# Patient Record
Sex: Female | Born: 1965 | Race: White | Hispanic: No | Smoking: Former smoker
Health system: Southern US, Community
[De-identification: ages and names within clinical notes are randomized; demographics above are authoritative.]

## PROBLEM LIST (undated history)

## (undated) DIAGNOSIS — F32A Depression, unspecified: Secondary | ICD-10-CM

## (undated) DIAGNOSIS — F329 Major depressive disorder, single episode, unspecified: Secondary | ICD-10-CM

## (undated) DIAGNOSIS — M1990 Unspecified osteoarthritis, unspecified site: Secondary | ICD-10-CM

## (undated) DIAGNOSIS — G4730 Sleep apnea, unspecified: Secondary | ICD-10-CM

## (undated) DIAGNOSIS — E785 Hyperlipidemia, unspecified: Secondary | ICD-10-CM

## (undated) DIAGNOSIS — E119 Type 2 diabetes mellitus without complications: Secondary | ICD-10-CM

## (undated) DIAGNOSIS — H409 Unspecified glaucoma: Secondary | ICD-10-CM

## (undated) DIAGNOSIS — I219 Acute myocardial infarction, unspecified: Secondary | ICD-10-CM

## (undated) DIAGNOSIS — I10 Essential (primary) hypertension: Secondary | ICD-10-CM

## (undated) HISTORY — DX: Essential (primary) hypertension: I10

## (undated) HISTORY — PX: TONSILLECTOMY: SUR1361

## (undated) HISTORY — DX: Acute myocardial infarction, unspecified: I21.9

## (undated) HISTORY — DX: Depression, unspecified: F32.A

## (undated) HISTORY — DX: Unspecified osteoarthritis, unspecified site: M19.90

## (undated) HISTORY — DX: Major depressive disorder, single episode, unspecified: F32.9

## (undated) HISTORY — DX: Type 2 diabetes mellitus without complications: E11.9

## (undated) HISTORY — PX: WISDOM TOOTH EXTRACTION: SHX21

## (undated) HISTORY — DX: Unspecified glaucoma: H40.9

## (undated) HISTORY — DX: Hyperlipidemia, unspecified: E78.5

## (undated) HISTORY — DX: Sleep apnea, unspecified: G47.30

## (undated) HISTORY — PX: APPENDECTOMY: SHX54

---

## 1998-10-10 ENCOUNTER — Emergency Department (HOSPITAL_COMMUNITY): Admission: EM | Admit: 1998-10-10 | Discharge: 1998-10-10 | Payer: Self-pay | Admitting: Emergency Medicine

## 2001-01-02 ENCOUNTER — Ambulatory Visit (HOSPITAL_COMMUNITY): Admission: RE | Admit: 2001-01-02 | Discharge: 2001-01-02 | Payer: Self-pay | Admitting: *Deleted

## 2001-01-02 ENCOUNTER — Encounter: Payer: Self-pay | Admitting: *Deleted

## 2001-02-09 ENCOUNTER — Inpatient Hospital Stay (HOSPITAL_COMMUNITY): Admission: AD | Admit: 2001-02-09 | Discharge: 2001-02-09 | Payer: Self-pay | Admitting: *Deleted

## 2001-04-25 ENCOUNTER — Encounter: Payer: Self-pay | Admitting: *Deleted

## 2001-04-25 ENCOUNTER — Ambulatory Visit (HOSPITAL_COMMUNITY): Admission: RE | Admit: 2001-04-25 | Discharge: 2001-04-25 | Payer: Self-pay | Admitting: *Deleted

## 2001-04-26 ENCOUNTER — Inpatient Hospital Stay (HOSPITAL_COMMUNITY): Admission: AD | Admit: 2001-04-26 | Discharge: 2001-04-28 | Payer: Self-pay | Admitting: *Deleted

## 2001-06-08 ENCOUNTER — Other Ambulatory Visit: Admission: RE | Admit: 2001-06-08 | Discharge: 2001-06-08 | Payer: Self-pay | Admitting: Obstetrics and Gynecology

## 2002-05-17 ENCOUNTER — Encounter (INDEPENDENT_AMBULATORY_CARE_PROVIDER_SITE_OTHER): Payer: Self-pay | Admitting: *Deleted

## 2002-05-17 ENCOUNTER — Emergency Department (HOSPITAL_COMMUNITY): Admission: EM | Admit: 2002-05-17 | Discharge: 2002-05-18 | Payer: Self-pay | Admitting: Emergency Medicine

## 2002-07-27 ENCOUNTER — Encounter: Payer: Self-pay | Admitting: Emergency Medicine

## 2002-07-27 ENCOUNTER — Emergency Department (HOSPITAL_COMMUNITY): Admission: EM | Admit: 2002-07-27 | Discharge: 2002-07-27 | Payer: Self-pay | Admitting: Emergency Medicine

## 2007-09-06 ENCOUNTER — Ambulatory Visit: Payer: Self-pay | Admitting: Obstetrics and Gynecology

## 2007-09-21 ENCOUNTER — Ambulatory Visit: Payer: Self-pay | Admitting: Obstetrics & Gynecology

## 2007-10-19 ENCOUNTER — Ambulatory Visit: Payer: Self-pay | Admitting: Obstetrics and Gynecology

## 2007-11-23 ENCOUNTER — Ambulatory Visit (HOSPITAL_COMMUNITY): Admission: RE | Admit: 2007-11-23 | Discharge: 2007-11-23 | Payer: Self-pay | Admitting: Obstetrics & Gynecology

## 2008-07-09 ENCOUNTER — Emergency Department (HOSPITAL_COMMUNITY): Admission: EM | Admit: 2008-07-09 | Discharge: 2008-07-09 | Payer: Self-pay | Admitting: Emergency Medicine

## 2009-02-13 ENCOUNTER — Ambulatory Visit (HOSPITAL_COMMUNITY): Admission: RE | Admit: 2009-02-13 | Discharge: 2009-02-13 | Payer: Self-pay | Admitting: Obstetrics & Gynecology

## 2010-02-17 ENCOUNTER — Ambulatory Visit (HOSPITAL_COMMUNITY): Admission: RE | Admit: 2010-02-17 | Discharge: 2010-02-17 | Payer: Self-pay | Admitting: Obstetrics & Gynecology

## 2010-02-24 ENCOUNTER — Encounter: Admission: RE | Admit: 2010-02-24 | Discharge: 2010-02-24 | Payer: Self-pay | Admitting: Obstetrics & Gynecology

## 2010-04-14 ENCOUNTER — Encounter: Admission: RE | Admit: 2010-04-14 | Discharge: 2010-04-14 | Payer: Self-pay | Admitting: Family Medicine

## 2010-06-11 ENCOUNTER — Encounter: Admission: RE | Admit: 2010-06-11 | Discharge: 2010-06-11 | Payer: Self-pay | Admitting: Family Medicine

## 2010-08-03 ENCOUNTER — Encounter
Admission: RE | Admit: 2010-08-03 | Discharge: 2010-08-03 | Payer: Self-pay | Source: Home / Self Care | Attending: Family Medicine | Admitting: Family Medicine

## 2010-11-22 ENCOUNTER — Encounter: Payer: Self-pay | Admitting: Obstetrics & Gynecology

## 2011-01-19 ENCOUNTER — Other Ambulatory Visit (HOSPITAL_COMMUNITY): Payer: Self-pay | Admitting: Family Medicine

## 2011-01-19 DIAGNOSIS — Z1231 Encounter for screening mammogram for malignant neoplasm of breast: Secondary | ICD-10-CM

## 2011-02-19 ENCOUNTER — Ambulatory Visit (HOSPITAL_COMMUNITY)
Admission: RE | Admit: 2011-02-19 | Discharge: 2011-02-19 | Disposition: A | Discharge status: Home / Self Care | Payer: Medicaid Other | Source: Ambulatory Visit | Attending: Family Medicine | Admitting: Family Medicine

## 2011-02-19 DIAGNOSIS — Z1231 Encounter for screening mammogram for malignant neoplasm of breast: Secondary | ICD-10-CM | POA: Insufficient documentation

## 2011-03-16 NOTE — Group Therapy Note (Signed)
NAME:  Kristin Reilly, Kristin Reilly NO.:  [id number]   MEDICAL RECORD NO.:  [id number]          PATIENT TYPE:  WOC   LOCATION:  [facility]                   FACILITY:  [facility]   PHYSICIAN:  Johnella Moloney, MD        DATE OF BIRTH:  1966-10-18   DATE OF SERVICE:  09/21/2007                                  CLINIC NOTE   Patient is a 45 year old gravida 3, para 1-0-2-1 who came in on September 06, 2007, desiring a tubal ligation.  At that visit, the risks of a tubal  ligation were discussed with the patient and she was told that given her  weight of 293 pounds at 5 feet 6 inches that this would increase her  risks of surgery.  An IUD was recommended at this point.  Patient  returns today for a 2nd opinion and wants to know if it is possible to  get her tubal ligation.  Upon review of her chart, patient was told that  the risks of surgery secondary to morbid obesity are still a concern  given that this was an elective surgery.  Patient wanted statistics on  her risks as she was told that no studies have been done looking at the  role of morbid obesity and increased risks in laparoscopic tubal  ligations.  Patient then decided that she would want to go ahead with a  Mirena IUD insertion.  The Mirena IUD risks and benefits were discussed  with the patient and an informed consent was obtained.   IUD INSERTION:  A pelvic examination was done and showed the patient to  have a small anteverted uterus.  Speculum was then placed in the  patient's vagina.  The cervix was cleaned with Betadine and her uterus  was noted to sound to 9 cm.  The Mirena IUD was then placed without  complication and the strings were cut to 3 cm outside the external os.  Patient had a small amount of bleeding from the tenaculum site which was  controlled using pressure.  Patient otherwise tolerated procedure well.   IMPRESSION:  Patient is a 45 year old G3, P1-0-2-1 status pot Mirena  intrauterine device placement for  contraception.  Patient tolerated  procedure well.  Patient was told to expect some cramping and some  bleeding after procedure.  She was told to take Motrin as needed for her  cramping.  She was also told that there was a risk of having irregular  bleeding and cramping for the next 3 months and to take Motrin as needed  for any pain.  Patient will follow up in 4 weeks for an intrauterine  device check.           ______________________________  Johnella Moloney, MD     UD/MEDQ  D:  09/21/2007  T:  09/22/2007  Job:  [id number]

## 2011-03-16 NOTE — Group Therapy Note (Signed)
NAME:  Kristin Reilly, Kristin Reilly NO.:  [id number]   MEDICAL RECORD NO.:  [id number]          PATIENT TYPE:  WOC   LOCATION:  [facility]                   FACILITY:  [facility]   PHYSICIAN:  Argentina Donovan, MD        DATE OF BIRTH:  July 22, 1966   DATE OF SERVICE:  09/06/2007                                  CLINIC NOTE   The patient is a 45 year old Caucasian female gravida 3, para 1-0-2-1  who came in desiring a tubal ligation.  I am trying to dissuade her  because of her 293 pounds at 5 feet 6 inches tall.  I told her it would  be a significant risk using laparoscopic exam, and I talked to her about  the IUD both the 10 and the 5-year.  Her biggest concern is down the  line at the end if she has to have it removed, she may not have  insurance to cover in 5 years if we put that one in and the 10-year when  she is worried about the cost coming out.  I told her that the cost  coming out is negligible, compared to the cost of putting another one in  or in the cost of what may happen if she has a complication with tubal  ligation.  She is going to think it over and my feeling is for her  probably, since she has no cramps with her period, and very light  periods, that a ParaGard might be the one that she might decide on.   IMPRESSION:  Patient for permanent birth control with morbid obesity.           ______________________________  Argentina Donovan, MD     PR/MEDQ  D:  09/06/2007  T:  09/07/2007  Job:  [id number]

## 2011-08-10 LAB — POCT PREGNANCY, URINE
Operator id: 159681
Preg Test, Ur: NEGATIVE

## 2012-01-10 ENCOUNTER — Other Ambulatory Visit (HOSPITAL_COMMUNITY): Payer: Self-pay | Admitting: Family Medicine

## 2012-01-10 DIAGNOSIS — Z1231 Encounter for screening mammogram for malignant neoplasm of breast: Secondary | ICD-10-CM

## 2012-02-21 ENCOUNTER — Ambulatory Visit (HOSPITAL_COMMUNITY)
Admission: RE | Admit: 2012-02-21 | Discharge: 2012-02-21 | Disposition: A | Discharge status: Home / Self Care | Payer: Medicaid Other | Source: Ambulatory Visit | Attending: Family Medicine | Admitting: Family Medicine

## 2012-02-21 DIAGNOSIS — Z1231 Encounter for screening mammogram for malignant neoplasm of breast: Secondary | ICD-10-CM

## 2012-04-03 ENCOUNTER — Other Ambulatory Visit: Payer: Self-pay | Admitting: Physician Assistant

## 2012-05-01 ENCOUNTER — Telehealth: Payer: Self-pay | Admitting: General Practice

## 2012-05-01 NOTE — Telephone Encounter (Signed)
I spoke with patient and advised her that spotting is not abnormal with the mirena. She voiced understanding and asked if it could be changed out earlier than November and if Medicaid would cover that. I checked with Mayra Neer and she stated that she didn't think that this would be a problem. Pt informed. She will call to make an appt.

## 2012-05-01 NOTE — Telephone Encounter (Signed)
Pt called and stated that she had her minera inserted November 2008 and it's supposed to be removed this November 2013, but for the past month to month and a half she's been having some "spotting issues". Patient states she wants to know if she should go ahead and schedule an appt to have it replaced now or should she continue with this one until November and states she would like a call back.

## 2012-05-26 ENCOUNTER — Other Ambulatory Visit: Payer: Self-pay | Admitting: Physician Assistant

## 2012-06-16 ENCOUNTER — Encounter: Payer: Self-pay | Admitting: Advanced Practice Midwife

## 2012-06-16 ENCOUNTER — Ambulatory Visit (INDEPENDENT_AMBULATORY_CARE_PROVIDER_SITE_OTHER): Payer: Medicaid Other | Admitting: Advanced Practice Midwife

## 2012-06-16 VITALS — BP 130/83 | HR 62 | Temp 97.4°F | Resp 12 | Ht 65.0 in | Wt 316.8 lb

## 2012-06-16 DIAGNOSIS — Z01812 Encounter for preprocedural laboratory examination: Secondary | ICD-10-CM

## 2012-06-16 NOTE — Progress Notes (Signed)
Was discussed with patient that since she has medicaid - it will not cover her to get IUD reinserted now, as is not due until 09/2012. Will reschedule to November appt.

## 2012-07-05 ENCOUNTER — Other Ambulatory Visit: Payer: Self-pay | Admitting: Family Medicine

## 2012-07-05 DIAGNOSIS — Z8249 Family history of ischemic heart disease and other diseases of the circulatory system: Secondary | ICD-10-CM

## 2012-07-12 ENCOUNTER — Telehealth: Payer: Self-pay | Admitting: Family Medicine

## 2012-07-12 ENCOUNTER — Other Ambulatory Visit: Payer: Self-pay | Admitting: Family Medicine

## 2012-07-12 ENCOUNTER — Ambulatory Visit
Admission: RE | Admit: 2012-07-12 | Discharge: 2012-07-12 | Disposition: A | Discharge status: Home / Self Care | Payer: Medicaid Other | Source: Ambulatory Visit | Attending: Family Medicine | Admitting: Family Medicine

## 2012-07-12 DIAGNOSIS — Z8249 Family history of ischemic heart disease and other diseases of the circulatory system: Secondary | ICD-10-CM

## 2012-07-12 DIAGNOSIS — M549 Dorsalgia, unspecified: Secondary | ICD-10-CM

## 2012-07-12 NOTE — Telephone Encounter (Signed)
I called patient because she had an appointment on 06/16/12 to have her IUD removed, and a new mirena inserted. Patient had IUD inserted 09/21/07. Was told by RN Bonita Quin that medicaid might not pay because it has to be 5 years. Patient has been instructed to call and make an appointment in October for an appointment in November after the 21st.

## 2012-08-10 ENCOUNTER — Encounter: Payer: Self-pay | Admitting: Obstetrics & Gynecology

## 2012-09-20 ENCOUNTER — Ambulatory Visit: Payer: Medicaid Other | Admitting: Obstetrics and Gynecology

## 2012-09-21 ENCOUNTER — Encounter: Payer: Self-pay | Admitting: Obstetrics & Gynecology

## 2012-09-21 ENCOUNTER — Ambulatory Visit (INDEPENDENT_AMBULATORY_CARE_PROVIDER_SITE_OTHER): Payer: Medicaid Other | Admitting: Obstetrics & Gynecology

## 2012-09-21 VITALS — BP 134/89 | HR 82 | Temp 97.3°F | Ht 65.0 in | Wt 305.7 lb

## 2012-09-21 DIAGNOSIS — Z30430 Encounter for insertion of intrauterine contraceptive device: Secondary | ICD-10-CM

## 2012-09-21 DIAGNOSIS — Z3009 Encounter for other general counseling and advice on contraception: Secondary | ICD-10-CM

## 2012-09-21 DIAGNOSIS — Z30431 Encounter for routine checking of intrauterine contraceptive device: Secondary | ICD-10-CM

## 2012-09-21 MED ORDER — LEVONORGESTREL 20 MCG/24HR IU IUD
INTRAUTERINE_SYSTEM | Freq: Once | INTRAUTERINE | Status: AC
  Administered 2012-09-21: 1 via INTRAUTERINE

## 2012-09-21 NOTE — Progress Notes (Signed)
Patient ID: Kristin Reilly, female   DOB: July 12, 1966, 46 y.o.   MRN: [id number] Patient identified, informed consent performed.  Discussed risks of irregular bleeding, cramping, infection, malpositioning or misplacement of the IUD outside the uterus which may require further procedures. Time out was performed.  Urine pregnancy test negative.  Speculum placed in the vagina.  Cervix visualized. Patients IUD strings noted and IUD removed.   Cervix cleaned with Betadine x 2. Hurricaine spray applied to ant lip of cervix.  Cervix grasped anteriorly with a single tooth tenaculum.  Uterus sounded to 7 cm.  Mirena IUD placed per manufacturer's recommendations.  Strings trimmed to 3 cm. Tenaculum was removed, good hemostasis noted.  Patient tolerated procedure well.   Patient was given post-procedure instructions and the Mirena care card with expiration date.  Patient was also asked to check IUD strings periodically.  She does not want a scheduled f/u appt but, will call if she has problems.  Saber Dickerman L. Harraway-Smith, M.D., Evern Core

## 2012-09-21 NOTE — Patient Instructions (Signed)

## 2012-09-21 NOTE — Addendum Note (Signed)
Addended by: Faythe Casa on: 09/21/2012 02:35 PM   Modules accepted: Orders

## 2012-09-21 NOTE — Progress Notes (Signed)
Here for IUD removal and reinsertion- has been in 5 years 1 day

## 2013-06-25 DIAGNOSIS — E559 Vitamin D deficiency, unspecified: Secondary | ICD-10-CM | POA: Insufficient documentation

## 2013-06-25 DIAGNOSIS — E781 Pure hyperglyceridemia: Secondary | ICD-10-CM | POA: Insufficient documentation

## 2013-06-25 DIAGNOSIS — R7309 Other abnormal glucose: Secondary | ICD-10-CM | POA: Insufficient documentation

## 2013-06-25 DIAGNOSIS — G4733 Obstructive sleep apnea (adult) (pediatric): Secondary | ICD-10-CM | POA: Insufficient documentation

## 2013-06-25 DIAGNOSIS — R6889 Other general symptoms and signs: Secondary | ICD-10-CM | POA: Insufficient documentation

## 2013-06-25 DIAGNOSIS — M79609 Pain in unspecified limb: Secondary | ICD-10-CM | POA: Insufficient documentation

## 2013-06-25 DIAGNOSIS — M549 Dorsalgia, unspecified: Secondary | ICD-10-CM | POA: Insufficient documentation

## 2013-06-25 DIAGNOSIS — R930 Abnormal findings on diagnostic imaging of skull and head, not elsewhere classified: Secondary | ICD-10-CM | POA: Insufficient documentation

## 2013-06-25 DIAGNOSIS — E668 Other obesity: Secondary | ICD-10-CM | POA: Insufficient documentation

## 2013-06-25 DIAGNOSIS — Z823 Family history of stroke: Secondary | ICD-10-CM | POA: Insufficient documentation

## 2013-11-19 DIAGNOSIS — Z87891 Personal history of nicotine dependence: Secondary | ICD-10-CM | POA: Insufficient documentation

## 2013-11-19 DIAGNOSIS — L84 Corns and callosities: Secondary | ICD-10-CM | POA: Insufficient documentation

## 2013-12-07 DIAGNOSIS — I10 Essential (primary) hypertension: Secondary | ICD-10-CM | POA: Insufficient documentation

## 2014-03-18 DIAGNOSIS — M25559 Pain in unspecified hip: Secondary | ICD-10-CM | POA: Insufficient documentation

## 2014-04-24 DIAGNOSIS — M7060 Trochanteric bursitis, unspecified hip: Secondary | ICD-10-CM | POA: Insufficient documentation

## 2014-04-24 DIAGNOSIS — M7062 Trochanteric bursitis, left hip: Secondary | ICD-10-CM | POA: Insufficient documentation

## 2014-05-27 ENCOUNTER — Other Ambulatory Visit: Payer: Self-pay | Admitting: Family

## 2014-05-27 DIAGNOSIS — R930 Abnormal findings on diagnostic imaging of skull and head, not elsewhere classified: Secondary | ICD-10-CM

## 2014-06-06 ENCOUNTER — Ambulatory Visit
Admission: RE | Admit: 2014-06-06 | Discharge: 2014-06-06 | Disposition: A | Discharge status: Home / Self Care | Payer: Managed Care, Other (non HMO) | Source: Ambulatory Visit | Attending: Family | Admitting: Family

## 2014-06-06 DIAGNOSIS — R930 Abnormal findings on diagnostic imaging of skull and head, not elsewhere classified: Secondary | ICD-10-CM

## 2014-08-16 DIAGNOSIS — R21 Rash and other nonspecific skin eruption: Secondary | ICD-10-CM | POA: Insufficient documentation

## 2014-09-02 ENCOUNTER — Encounter: Payer: Self-pay | Admitting: Obstetrics & Gynecology

## 2014-11-18 ENCOUNTER — Ambulatory Visit
Admission: RE | Admit: 2014-11-18 | Discharge: 2014-11-18 | Disposition: A | Discharge status: Home / Self Care | Payer: Managed Care, Other (non HMO) | Source: Ambulatory Visit | Attending: Nurse Practitioner | Admitting: Nurse Practitioner

## 2014-11-18 ENCOUNTER — Other Ambulatory Visit: Payer: Self-pay | Admitting: Nurse Practitioner

## 2014-11-18 DIAGNOSIS — R0602 Shortness of breath: Secondary | ICD-10-CM

## 2014-11-18 DIAGNOSIS — R059 Cough, unspecified: Secondary | ICD-10-CM

## 2014-11-18 DIAGNOSIS — R05 Cough: Secondary | ICD-10-CM

## 2014-11-20 ENCOUNTER — Encounter: Payer: Self-pay | Admitting: Cardiovascular Disease

## 2014-12-23 DIAGNOSIS — J988 Other specified respiratory disorders: Secondary | ICD-10-CM | POA: Insufficient documentation

## 2014-12-30 ENCOUNTER — Other Ambulatory Visit: Payer: Self-pay | Admitting: Family Medicine

## 2014-12-30 ENCOUNTER — Ambulatory Visit
Admission: RE | Admit: 2014-12-30 | Discharge: 2014-12-30 | Disposition: A | Discharge status: Home / Self Care | Payer: Managed Care, Other (non HMO) | Source: Ambulatory Visit | Attending: Family Medicine | Admitting: Family Medicine

## 2014-12-30 DIAGNOSIS — R0600 Dyspnea, unspecified: Secondary | ICD-10-CM

## 2014-12-30 DIAGNOSIS — R0602 Shortness of breath: Secondary | ICD-10-CM

## 2014-12-30 DIAGNOSIS — R059 Cough, unspecified: Secondary | ICD-10-CM

## 2014-12-30 DIAGNOSIS — R05 Cough: Secondary | ICD-10-CM

## 2015-03-21 ENCOUNTER — Other Ambulatory Visit: Payer: Self-pay | Admitting: Nurse Practitioner

## 2015-03-21 ENCOUNTER — Other Ambulatory Visit: Payer: Self-pay

## 2015-03-21 DIAGNOSIS — Z1231 Encounter for screening mammogram for malignant neoplasm of breast: Secondary | ICD-10-CM

## 2015-03-27 ENCOUNTER — Encounter: Payer: Self-pay | Admitting: Internal Medicine

## 2015-04-02 ENCOUNTER — Ambulatory Visit
Admission: RE | Admit: 2015-04-02 | Discharge: 2015-04-02 | Disposition: A | Discharge status: Home / Self Care | Payer: Managed Care, Other (non HMO) | Source: Ambulatory Visit

## 2015-04-02 DIAGNOSIS — Z1231 Encounter for screening mammogram for malignant neoplasm of breast: Secondary | ICD-10-CM

## 2015-05-19 ENCOUNTER — Encounter: Payer: Self-pay | Admitting: *Deleted

## 2015-05-19 ENCOUNTER — Other Ambulatory Visit: Payer: Self-pay | Admitting: *Deleted

## 2015-06-05 ENCOUNTER — Other Ambulatory Visit: Payer: Self-pay

## 2015-06-05 ENCOUNTER — Telehealth: Payer: Self-pay | Admitting: *Deleted

## 2015-06-05 DIAGNOSIS — Z1211 Encounter for screening for malignant neoplasm of colon: Secondary | ICD-10-CM

## 2015-06-05 NOTE — Telephone Encounter (Signed)
Kristin Reilly, Can you schedule this for me please?  Thanks, [employer]

## 2015-06-05 NOTE — Telephone Encounter (Signed)
Pt has been scheduled for colon with propofol at [facility] 08/05/15@9 :15am. Please let her  Know during the previsit.

## 2015-06-05 NOTE — Telephone Encounter (Signed)
[place] for next Tuesday am outpatient day at [facility] for screening colon with MAC

## 2015-06-05 NOTE — Telephone Encounter (Signed)
Dr. Hilarie Fredrickson, This pt is coming in for a PV on Monday for a screening colonoscopy.  Her last BMI on 03-19-15 was 52.40.  There is not a lot of info in EPIC about this pt- the last encounter was from 2013.  We do have referral information from her PCP- I'll put this in your inbox on your desk.   If she comes in for her PV and is BMI is still above 50, is she ok for a direct hospital colonoscopy or would you like an office visit first?  Thanks, Cyril Mourning

## 2015-06-09 ENCOUNTER — Ambulatory Visit (AMBULATORY_SURGERY_CENTER): Payer: Self-pay | Admitting: *Deleted

## 2015-06-09 VITALS — Ht 66.0 in | Wt 321.2 lb

## 2015-06-09 DIAGNOSIS — Z1211 Encounter for screening for malignant neoplasm of colon: Secondary | ICD-10-CM

## 2015-06-09 MED ORDER — NA SULFATE-K SULFATE-MG SULF 17.5-3.13-1.6 GM/177ML PO SOLN
ORAL | Status: DC
Start: 2015-06-09 — End: 2018-12-19

## 2015-06-09 NOTE — Progress Notes (Signed)
Patient denies any allergies to eggs or soy. Patient denies any problems with anesthesia/sedation. Patient denies any oxygen use at home and does not take any diet/weight loss medications. EMMI education assisgned to patient on colonoscopy, this was explained and instructions given to patient. 

## 2015-06-23 ENCOUNTER — Encounter: Payer: Managed Care, Other (non HMO) | Admitting: Internal Medicine

## 2015-07-03 ENCOUNTER — Encounter: Payer: Self-pay | Admitting: Internal Medicine

## 2015-07-04 ENCOUNTER — Encounter: Payer: Self-pay | Admitting: Cardiovascular Disease

## 2015-07-04 ENCOUNTER — Encounter: Payer: Self-pay | Admitting: Internal Medicine

## 2015-07-22 ENCOUNTER — Encounter (HOSPITAL_COMMUNITY): Payer: Self-pay | Admitting: *Deleted

## 2015-08-05 ENCOUNTER — Encounter (HOSPITAL_COMMUNITY): Payer: Self-pay | Admitting: *Deleted

## 2015-08-05 ENCOUNTER — Ambulatory Visit (HOSPITAL_COMMUNITY): Payer: Managed Care, Other (non HMO) | Admitting: Registered Nurse

## 2015-08-05 ENCOUNTER — Encounter (HOSPITAL_COMMUNITY)
Admission: RE | Disposition: A | Discharge status: Home / Self Care | Payer: Self-pay | Source: Ambulatory Visit | Attending: Internal Medicine

## 2015-08-05 ENCOUNTER — Ambulatory Visit (HOSPITAL_COMMUNITY)
Admission: RE | Admit: 2015-08-05 | Discharge: 2015-08-05 | Disposition: A | Discharge status: Home / Self Care | Payer: Managed Care, Other (non HMO) | Source: Ambulatory Visit | Attending: Internal Medicine | Admitting: Internal Medicine

## 2015-08-05 DIAGNOSIS — Z87891 Personal history of nicotine dependence: Secondary | ICD-10-CM | POA: Diagnosis not present

## 2015-08-05 DIAGNOSIS — D123 Benign neoplasm of transverse colon: Secondary | ICD-10-CM | POA: Diagnosis not present

## 2015-08-05 DIAGNOSIS — I10 Essential (primary) hypertension: Secondary | ICD-10-CM | POA: Diagnosis not present

## 2015-08-05 DIAGNOSIS — K648 Other hemorrhoids: Secondary | ICD-10-CM | POA: Diagnosis not present

## 2015-08-05 DIAGNOSIS — D128 Benign neoplasm of rectum: Secondary | ICD-10-CM | POA: Diagnosis not present

## 2015-08-05 DIAGNOSIS — K5730 Diverticulosis of large intestine without perforation or abscess without bleeding: Secondary | ICD-10-CM | POA: Diagnosis not present

## 2015-08-05 DIAGNOSIS — K621 Rectal polyp: Secondary | ICD-10-CM | POA: Diagnosis not present

## 2015-08-05 DIAGNOSIS — Z1211 Encounter for screening for malignant neoplasm of colon: Secondary | ICD-10-CM | POA: Diagnosis not present

## 2015-08-05 DIAGNOSIS — Z6841 Body Mass Index (BMI) 40.0 and over, adult: Secondary | ICD-10-CM | POA: Diagnosis not present

## 2015-08-05 DIAGNOSIS — G4730 Sleep apnea, unspecified: Secondary | ICD-10-CM | POA: Insufficient documentation

## 2015-08-05 HISTORY — PX: COLONOSCOPY: SHX5424

## 2015-08-05 SURGERY — COLONOSCOPY
Anesthesia: Monitor Anesthesia Care

## 2015-08-05 MED ORDER — LACTATED RINGERS IV SOLN
INTRAVENOUS | Status: DC
  Administered 2015-08-05: 1000 mL via INTRAVENOUS

## 2015-08-05 MED ORDER — GLYCOPYRROLATE 0.2 MG/ML IJ SOLN
INTRAMUSCULAR | Status: DC | PRN
  Administered 2015-08-05: 0.2 mg via INTRAVENOUS

## 2015-08-05 MED ORDER — SODIUM CHLORIDE 0.9 % IV SOLN: INTRAVENOUS | Status: DC

## 2015-08-05 MED ORDER — ONDANSETRON HCL 4 MG/2ML IJ SOLN
INTRAMUSCULAR | Status: AC
  Filled 2015-08-05: qty 2

## 2015-08-05 MED ORDER — PROPOFOL 10 MG/ML IV BOLUS
INTRAVENOUS | Status: AC
  Filled 2015-08-05: qty 20

## 2015-08-05 MED ORDER — PROPOFOL 500 MG/50ML IV EMUL
INTRAVENOUS | Status: DC | PRN
  Administered 2015-08-05: 200 ug/kg/min via INTRAVENOUS

## 2015-08-05 MED ORDER — LABETALOL HCL 5 MG/ML IV SOLN
INTRAVENOUS | Status: AC
  Filled 2015-08-05: qty 4

## 2015-08-05 MED ORDER — GLYCOPYRROLATE 0.2 MG/ML IJ SOLN
INTRAMUSCULAR | Status: AC
  Filled 2015-08-05: qty 1

## 2015-08-05 MED ORDER — ONDANSETRON HCL 4 MG/2ML IJ SOLN
INTRAMUSCULAR | Status: DC | PRN
  Administered 2015-08-05: 4 mg via INTRAVENOUS

## 2015-08-05 MED ORDER — LABETALOL HCL 5 MG/ML IV SOLN
INTRAVENOUS | Status: DC | PRN
  Administered 2015-08-05: 5 mg via INTRAVENOUS
  Administered 2015-08-05: 2.5 mg via INTRAVENOUS

## 2015-08-05 NOTE — H&P (Signed)
  HPI: Kristin Reilly is a 49 year old female with past medical history of hypertension, sleep apnea and morbid obesity who presents for outpatient screening colonoscopy. She has never had a colonoscopy before. Reports no change in bowel habit, including the rectal bleeding or melena. No abdominal pain. No diarrhea or constipation. Her brother had polyps when he was a young child but she is not aware that they were precancerous. No family history of colorectal cancer or IBD. She sees Dr. Coletta Memos for primary care.  Past Medical History  Diagnosis Date  . Hypertension   . Depression   . Sleep apnea     no cpap    Past Surgical History  Procedure Laterality Date  . Appendectomy    . Tonsillectomy    . Wisdom tooth extraction       (Not in an outpatient encounter)  Allergies  Allergen Reactions  . Fenofibrate Rash  . Tylox [Oxycodone-Acetaminophen] Other (See Comments)    Migraine HA and GI upset    Family History  Problem Relation Age of Onset  . CVA Mother   . Cerebral aneurysm Mother   . Clotting disorder Mother   . Aneurysm Maternal Grandmother   . Mental illness Sister   . Alcoholism Brother   . Throat cancer Maternal Grandfather   . Bipolar disorder Sister   . ADD / ADHD Son   . Colon cancer Neg Hx     Social History  Substance Use Topics  . Smoking status: Former Smoker    Quit date: 07/21/2005  . Smokeless tobacco: Never Used     Comment: quit 2008  . Alcohol Use: 0.0 oz/week    0 Standard drinks or equivalent per week     Comment: occasionally    ROS: As per history of present illness, otherwise negative  BP 160/74 mmHg  Temp(Src) 97.6 F (36.4 C) (Oral)  Resp 11  Ht 5\' 6"  (1.676 m)  Wt 315 lb (142.883 kg)  BMI 50.87 kg/m2  SpO2 98% Gen: awake, alert, NAD HEENT: anicteric, op clear CV: RRR, no mrg Pulm: CTA b/l Abd: soft, obese, NT/ND, +BS throughout Ext: no c/c/e Neuro: nonfocal   ASSESSMENT/PLAN:  49 year old female with past medical  history of hypertension, sleep apnea and morbid obesity who presents for outpatient screening colonoscopy.   1. CRC screening -- outpatient screening colonoscopy today with monitored anesthesia care.  The nature of the procedure, as well as the risks, benefits, and alternatives were carefully and thoroughly reviewed with the patient. Ample time for discussion and questions allowed. The patient understood, was satisfied, and agreed to proceed.

## 2015-08-05 NOTE — Anesthesia Preprocedure Evaluation (Signed)
Anesthesia Evaluation  Patient identified by MRN, date of birth, ID band Patient awake    Reviewed: Allergy & Precautions, NPO status , Patient's Chart, lab work & pertinent test results  Airway Mallampati: II  TM Distance: >3 FB Neck ROM: Full    Dental no notable dental hx.    Pulmonary sleep apnea , former smoker,    Pulmonary exam normal breath sounds clear to auscultation       Cardiovascular hypertension, Pt. on medications Normal cardiovascular exam Rhythm:Regular Rate:Normal     Neuro/Psych negative neurological ROS  negative psych ROS   GI/Hepatic negative GI ROS, Neg liver ROS,   Endo/Other  Morbid obesity  Renal/GU negative Renal ROS  negative genitourinary   Musculoskeletal negative musculoskeletal ROS (+)   Abdominal   Peds negative pediatric ROS (+)  Hematology negative hematology ROS (+)   Anesthesia Other Findings   Reproductive/Obstetrics negative OB ROS                             Anesthesia Physical Anesthesia Plan  ASA: III  Anesthesia Plan: MAC   Post-op Pain Management:    Induction:   Airway Management Planned: Simple Face Mask  Additional Equipment:   Intra-op Plan:   Post-operative Plan:   Informed Consent: I have reviewed the patients History and Physical, chart, labs and discussed the procedure including the risks, benefits and alternatives for the proposed anesthesia with the patient or authorized representative who has indicated his/her understanding and acceptance.   Dental advisory given  Plan Discussed with: CRNA  Anesthesia Plan Comments:         Anesthesia Quick Evaluation

## 2015-08-05 NOTE — Transfer of Care (Signed)
Immediate Anesthesia Transfer of Care Note  Patient: Kristin Reilly  Procedure(s) Performed: Procedure(s): COLONOSCOPY (N/A)  Patient Location: PACU  Anesthesia Type:MAC  Level of Consciousness: awake, alert , oriented and patient cooperative  Airway & Oxygen Therapy: Patient Spontanous Breathing and Patient connected to face mask oxygen  Post-op Assessment: Report given to RN, Post -op Vital signs reviewed and stable and Patient moving all extremities X 4  Post vital signs: stable  Last Vitals:  Filed Vitals:   08/05/15 0952  BP: 120/61  Temp:   Resp: 15    Complications: No apparent anesthesia complications

## 2015-08-05 NOTE — Discharge Instructions (Signed)

## 2015-08-05 NOTE — Op Note (Signed)
[facility] [place], [address]   COLONOSCOPY PROCEDURE REPORT  PATIENT: Kristin Reilly, Kristin Reilly  MR#: [id number] BIRTHDATE: 10-01-1966 , 49  yrs. old GENDER: female ENDOSCOPIST: Jerene Bears, MD REFERRED QQ:PYPPJ Bouska, M.D. PROCEDURE DATE:  08/05/2015 PROCEDURE:   Colonoscopy, screening, Colonoscopy with cold biopsy polypectomy, and Colonoscopy with snare polypectomy First Screening Colonoscopy - Avg.  risk and is 50 yrs.  old or older Yes.  Prior Negative Screening - Now for repeat screening. N/A  History of Adenoma - Now for follow-up colonoscopy & has been > or = to 3 yrs.  N/A  Polyps removed today? Yes ASA CLASS:   Class III INDICATIONS:Screening for colonic neoplasia, Colorectal Neoplasm Risk Assessment for this procedure is average risk, and 1st colonoscopy. MEDICATIONS: Monitored anesthesia care and Per Anesthesia  DESCRIPTION OF PROCEDURE:   After the risks benefits and alternatives of the procedure were thoroughly explained, informed consent was obtained.  The digital rectal exam revealed no rectal mass.   The Pentax Ped Colon [id number]  endoscope was introduced through the anus and advanced to the cecum, which was identified by both the appendix and ileocecal valve. No adverse events experienced.   The quality of the prep was good.  (Suprep was used) The instrument was then slowly withdrawn as the colon was fully examined. Estimated blood loss is zero unless otherwise noted in this procedure report.  COLON FINDINGS: Lipomatous IC valve.  A sessile polyp measuring 10 mm in size was found in the proximal transverse colon.  A polypectomy was performed with a cold snare.  The resection was complete, the polyp tissue was completely retrieved and sent to histology.   A sessile polyp measuring 3 mm in size was found in the rectum.  A polypectomy was performed with cold forceps.  The resection was complete, the polyp tissue was completely  retrieved and sent to histology.   There was mild diverticulosis noted in the ascending colon, at the cecum, and in the left colon.  Retroflexed views revealed internal hemorrhoids. The time to cecum = 2.3 Withdrawal time = 19.5   The scope was withdrawn and the procedure completed. COMPLICATIONS: There were no immediate complications.  ENDOSCOPIC IMPRESSION: 1.   Sessile polyp was found in the proximal transverse colon; polypectomy was performed with a cold snare 2.   Sessile polyp was found in the rectum; polypectomy was performed with cold forceps 3.   Mild diverticulosis was noted in the ascending colon, at the cecum, and in the left colon  RECOMMENDATIONS: 1.  Await pathology results 2.  High fiber diet 3.  Timing of repeat colonoscopy will be determined by pathology findings. 4.  You will receive a letter within 1-2 weeks with the results of your biopsy as well as final recommendations.  Please call my office if you have not received a letter after 3 weeks.  eSigned:  Jerene Bears, MD 08/05/2015 9:53 AM   cc: Bernerd Limbo, MD and The Patient

## 2015-08-06 ENCOUNTER — Encounter (HOSPITAL_COMMUNITY): Payer: Self-pay | Admitting: Internal Medicine

## 2015-08-06 ENCOUNTER — Encounter: Payer: Self-pay | Admitting: Internal Medicine

## 2015-08-06 NOTE — Anesthesia Postprocedure Evaluation (Signed)
  Anesthesia Post-op Note  Patient: Kristin Reilly  Procedure(s) Performed: Procedure(s) (LRB): COLONOSCOPY (N/A)  Patient Location: PACU  Anesthesia Type: MAC  Level of Consciousness: awake and alert   Airway and Oxygen Therapy: Patient Spontanous Breathing  Post-op Pain: mild  Post-op Assessment: Post-op Vital signs reviewed, Patient's Cardiovascular Status Stable, Respiratory Function Stable, Patent Airway and No signs of Nausea or vomiting  Last Vitals:  Filed Vitals:   08/05/15 1009  BP: 118/52  Temp:   Resp: 16    Post-op Vital Signs: stable   Complications: No apparent anesthesia complications

## 2015-08-09 ENCOUNTER — Encounter (HOSPITAL_COMMUNITY): Payer: Self-pay

## 2015-08-09 ENCOUNTER — Emergency Department (HOSPITAL_COMMUNITY): Payer: Managed Care, Other (non HMO)

## 2015-08-09 ENCOUNTER — Emergency Department (HOSPITAL_COMMUNITY)
Admission: EM | Admit: 2015-08-09 | Discharge: 2015-08-09 | Disposition: A | Discharge status: Home / Self Care | Payer: Managed Care, Other (non HMO) | Attending: Emergency Medicine | Admitting: Emergency Medicine

## 2015-08-09 DIAGNOSIS — Z87891 Personal history of nicotine dependence: Secondary | ICD-10-CM | POA: Diagnosis not present

## 2015-08-09 DIAGNOSIS — M7551 Bursitis of right shoulder: Secondary | ICD-10-CM | POA: Insufficient documentation

## 2015-08-09 DIAGNOSIS — Z8659 Personal history of other mental and behavioral disorders: Secondary | ICD-10-CM | POA: Diagnosis not present

## 2015-08-09 DIAGNOSIS — Z8669 Personal history of other diseases of the nervous system and sense organs: Secondary | ICD-10-CM | POA: Insufficient documentation

## 2015-08-09 DIAGNOSIS — Z791 Long term (current) use of non-steroidal anti-inflammatories (NSAID): Secondary | ICD-10-CM | POA: Insufficient documentation

## 2015-08-09 DIAGNOSIS — I10 Essential (primary) hypertension: Secondary | ICD-10-CM | POA: Insufficient documentation

## 2015-08-09 DIAGNOSIS — M79601 Pain in right arm: Secondary | ICD-10-CM | POA: Diagnosis present

## 2015-08-09 MED ORDER — ACETAMINOPHEN 500 MG PO TABS
1000.0000 mg | ORAL_TABLET | Freq: Once | ORAL | Status: AC
  Administered 2015-08-09: 1000 mg via ORAL
  Filled 2015-08-09: qty 2

## 2015-08-09 NOTE — ED Provider Notes (Signed)
CSN: [id number]     Arrival date & time 08/09/15  0932 History   First MD Initiated Contact with Patient 08/09/15 0940     Chief Complaint  Patient presents with  . Arm Pain     (Consider location/radiation/quality/duration/timing/severity/associated sxs/prior Treatment) Patient is a 49 y.o. female presenting with shoulder pain. The history is provided by the patient.  Shoulder Pain Location:  Shoulder Time since incident:  1 week Injury: no   Shoulder location:  R shoulder Pain details:    Quality:  Aching   Radiates to:  Does not radiate   Severity:  Moderate   Onset quality:  Gradual   Duration:  1 week   Timing:  Constant   Progression:  Worsening Chronicity:  New Handedness:  Left-handed Prior injury to area:  No Relieved by:  Rest Worsened by:  Movement and bearing weight Ineffective treatments:  None tried Associated symptoms: tingling   Associated symptoms: no fever    49 yo F with a chief complaint right shoulder pain. This been going on for about a week. Patient denies any injury. Had a colonoscopy about a week ago but says her pain was prior to that event. Slowly worsening. Patient is taking Celebrex not taking any other medication for this. Patient denies any chest pain or shortness breath. Pain is worse with forward flexion.  Past Medical History  Diagnosis Date  . Hypertension   . Depression   . Sleep apnea     no cpap   Past Surgical History  Procedure Laterality Date  . Appendectomy    . Tonsillectomy    . Wisdom tooth extraction    . Colonoscopy N/A 08/05/2015    Procedure: COLONOSCOPY;  Surgeon: Jerene Bears, MD;  Location: [facility];  Service: Gastroenterology;  Laterality: N/A;   Family History  Problem Relation Age of Onset  . CVA Mother   . Cerebral aneurysm Mother   . Clotting disorder Mother   . Aneurysm Maternal Grandmother   . Mental illness Sister   . Alcoholism Brother   . Throat cancer Maternal Grandfather   . Bipolar disorder  Sister   . ADD / ADHD Son   . Colon cancer Neg Hx    Social History  Substance Use Topics  . Smoking status: Former Smoker    Quit date: 07/21/2005  . Smokeless tobacco: Never Used     Comment: quit 2008  . Alcohol Use: 0.0 oz/week    0 Standard drinks or equivalent per week     Comment: occasionally   OB History    Gravida Para Term Preterm AB TAB SAB Ectopic Multiple Living   3 1 1  0 2 1 1  0 0 1     Review of Systems  Constitutional: Negative for fever and chills.  HENT: Negative for congestion and rhinorrhea.   Eyes: Negative for redness and visual disturbance.  Respiratory: Negative for shortness of breath and wheezing.   Cardiovascular: Negative for chest pain and palpitations.  Gastrointestinal: Negative for nausea and vomiting.  Genitourinary: Negative for dysuria and urgency.  Musculoskeletal: Positive for myalgias and arthralgias.  Skin: Negative for pallor and wound.  Neurological: Negative for dizziness and headaches.      Allergies  Fenofibrate and Tylox  Home Medications   Prior to Admission medications   Medication Sig Start Date End Date Taking? Authorizing Provider  celecoxib (CELEBREX) 200 MG capsule Take 200 mg by mouth daily. For pain 03/18/15 03/17/16 Yes Historical Provider, MD  hydrochlorothiazide (  HYDRODIURIL) 25 MG tablet Take 25 mg by mouth daily as needed (FOR FLUID).    Yes Historical Provider, MD  levonorgestrel (MIRENA, 52 MG,) 20 MCG/24HR IUD 20 mcg by Intrauterine route once. IUD - inserted 11/22/2011   Yes Historical Provider, MD  olmesartan (BENICAR) 20 MG tablet Take 20 mg by mouth every morning.    Yes Historical Provider, MD  Na Sulfate-K Sulfate-Mg Sulf SOLN Suprep (no substitutions)-TAKE AS DIRECTED. Patient not taking: Reported on 08/09/2015 06/09/15   Jerene Bears, MD   BP 141/69 mmHg  Pulse 70  Temp(Src) 97.7 F (36.5 C) (Oral)  Resp 16  SpO2 99% Physical Exam  Constitutional: She is oriented to person, place, and time. She  appears well-developed and well-nourished. No distress.  HENT:  Head: Normocephalic and atraumatic.  Eyes: EOM are normal. Pupils are equal, round, and reactive to light.  Neck: Normal range of motion. Neck supple.  Cardiovascular: Normal rate and regular rhythm.  Exam reveals no gallop and no friction rub.   No murmur heard. Pulmonary/Chest: Effort normal. She has no wheezes. She has no rales.  Abdominal: Soft. She exhibits no distension. There is no tenderness. There is no rebound and no guarding.  Musculoskeletal: She exhibits tenderness. She exhibits no edema.  Tender palpation about the right before meals joint in the right biceps groove. Pain when patient raises her arm with forward flexion, up to about 90. Mild weakness to the supraspinatus. Otherwise sits muscles intact  Neurological: She is alert and oriented to person, place, and time.  Skin: Skin is warm and dry. She is not diaphoretic.  Psychiatric: She has a normal mood and affect. Her behavior is normal.    ED Course  Procedures (including critical care time) Labs Review Labs Reviewed - No data to display  Imaging Review Dg Shoulder Right  08/09/2015   CLINICAL DATA:  Right shoulder pain.  EXAM: RIGHT SHOULDER - 2+ VIEW  COMPARISON:  None.  FINDINGS: There is no evidence of fracture or dislocation. There are mild proliferative changes involving the acromion. The [facility] joint shows normal alignment. No bony lesions or destruction identified. Soft tissues are unremarkable.  IMPRESSION: No acute findings. Mild proliferative changes involving the acromion.   Electronically Signed   By: Aletta Edouard M.D.   On: 08/09/2015 10:24   I have personally reviewed and evaluated these images and lab results as part of my medical decision-making.   EKG Interpretation None      MDM   Final diagnoses:  Subacromial bursitis, right    49 yo F chief complaint of right shoulder pain. By H&P most likely subacromial bursitis. Patient was  having some paresthesias down the arm. Started elbow involves the entire hand. Nonreproducible improved by arrival to the ED. Pulse motor and sensation intact distally. Will x-ray the right shoulder. Have patient continue NSAIDs and Tylenol. Sling and rest PCP follow-up.   I have discussed the diagnosis/risks/treatment options with the patient and believe the pt to be eligible for discharge home to follow-up with PCP. We also discussed returning to the ED immediately if new or worsening sx occur. We discussed the sx which are most concerning (e.g., sudden worsening pain, chest pain, sob) that necessitate immediate return. Medications administered to the patient during their visit and any new prescriptions provided to the patient are listed below.  Medications given during this visit Medications  acetaminophen (TYLENOL) tablet 1,000 mg (1,000 mg Oral Given 08/09/15 0956)    Discharge Medication List as  of 08/09/2015 10:46 AM      The patient appears reasonably screen and/or stabilized for discharge and I doubt any other medical condition or other [facility] requiring further screening, evaluation, or treatment in the ED at this time prior to discharge.    Deno Etienne, DO 08/09/15 1504

## 2015-08-09 NOTE — ED Notes (Signed)
Pt c/o right shoulder pain x 1wk.  Pt denies injury, states arm pain has progressively worsened since onset.  Reports she had numbness in right arm and hand this morning upon waking up.  Denies headache, chest pain.  States pain is worse with movement and weight bearing.

## 2015-08-09 NOTE — ED Notes (Signed)
She c/o some non-traumatic right arm discomfort x ~ 1 week.  She states this persists; and now c/o some paresthesias of right arm and hand.  She is in no distress and specifically denies h/a.

## 2015-08-09 NOTE — Discharge Instructions (Signed)
Take tylenol four times a day for pain.  Talk to your doctor about other pain management. Bursitis Bursitis is inflammation and irritation of a bursa, which is one of the small, fluid-filled sacs that cushion and protect the moving parts of your body. These sacs are located between bones and muscles, muscle attachments, or skin areas next to bones. A bursa protects these structures from the wear and tear that results from frequent movement. An inflamed bursa causes pain and swelling. Fluid may build up inside the sac. Bursitis is most common near joints, especially the knees, elbows, hips, and shoulders. CAUSES Bursitis can be caused by:   Injury from:  A direct blow, like falling on your knee or elbow.  Overuse of a joint (repetitive stress).  Infection. This can happen if bacteria gets into a bursa through a cut or scrape near a joint.  Diseases that cause joint inflammation, such as gout and rheumatoid arthritis. RISK FACTORS You may be at risk for bursitis if you:   Have a job or hobby that involves a lot of repetitive stress on your joints.  Have a condition that weakens your body's defense system (immune system), such as diabetes, cancer, or HIV.  Lift and reach overhead often.  Kneel or lean on hard surfaces often.  Run or walk often. SIGNS AND SYMPTOMS The most common signs and symptoms of bursitis are:  Pain that gets worse when you move the affected body part or put weight on it.  Inflammation.  Stiffness. Other signs and symptoms may include:  Redness.  Tenderness.  Warmth.  Pain that continues after rest.  Fever and chills. This may occur in bursitis caused by infection. DIAGNOSIS Bursitis may be diagnosed by:   Medical history and physical exam.  MRI.  A procedure to drain fluid from the bursa with a needle (aspiration). The fluid may be checked for signs of infection or gout.  Blood tests to rule out other causes of inflammation. TREATMENT    Bursitis can usually be treated at home with rest, ice, compression, and elevation (RICE). For mild bursitis, RICE treatment may be all you need. Other treatments may include:  Nonsteroidal anti-inflammatory drugs (NSAIDs) to treat pain and inflammation.  Corticosteroids to fight inflammation. You may have these drugs injected into and around the area of bursitis.  Aspiration of bursitis fluid to relieve pain and improve movement.  Antibiotic medicine to treat an infected bursa.  A splint, brace, or walking aid.  Physical therapy if you continue to have pain or limited movement.  Surgery to remove a damaged or infected bursa. This may be needed if you have a very bad case of bursitis or if other treatments have not worked. HOME CARE INSTRUCTIONS   Take medicines only as directed by your health care provider.  If you were prescribed an antibiotic medicine, finish it all even if you start to feel better.  Rest the affected area as directed by your health care provider.  Keep the area elevated.  Avoid activities that make pain worse.  Apply ice to the injured area:  Place ice in a plastic bag.  Place a towel between your skin and the bag.  Leave the ice on for 20 minutes, 2-3 times a day.  Use splints, braces, pads, or walking aids as directed by your health care provider.  Keep all follow-up visits as directed by your health care provider. This is important. PREVENTION   Wear knee pads if you kneel often.  Wear  sturdy running or walking shoes that fit you well.  Take regular breaks from repetitive activity.  Warm up by stretching before doing any strenuous activity.  Maintain a healthy weight or lose weight as recommended by your health care provider. Ask your health care provider if you need help.  Exercise regularly. Start any new physical activity gradually. SEEK MEDICAL CARE IF:   Your bursitis is not responding to treatment or home care.  You have a  fever.  You have chills.   This information is not intended to replace advice given to you by your health care provider. Make sure you discuss any questions you have with your health care provider.   Document Released: 10/15/2000 Document Revised: 07/09/2015 Document Reviewed: 01/07/2014 Elsevier Interactive Patient Education [employer].

## 2015-08-13 ENCOUNTER — Emergency Department (HOSPITAL_COMMUNITY): Payer: Managed Care, Other (non HMO)

## 2015-08-13 ENCOUNTER — Emergency Department (HOSPITAL_COMMUNITY)
Admission: EM | Admit: 2015-08-13 | Discharge: 2015-08-13 | Disposition: A | Discharge status: Home / Self Care | Payer: Managed Care, Other (non HMO) | Attending: Emergency Medicine | Admitting: Emergency Medicine

## 2015-08-13 DIAGNOSIS — Z87891 Personal history of nicotine dependence: Secondary | ICD-10-CM | POA: Diagnosis not present

## 2015-08-13 DIAGNOSIS — M1990 Unspecified osteoarthritis, unspecified site: Secondary | ICD-10-CM | POA: Insufficient documentation

## 2015-08-13 DIAGNOSIS — Y998 Other external cause status: Secondary | ICD-10-CM | POA: Diagnosis not present

## 2015-08-13 DIAGNOSIS — Z8669 Personal history of other diseases of the nervous system and sense organs: Secondary | ICD-10-CM | POA: Insufficient documentation

## 2015-08-13 DIAGNOSIS — Y92009 Unspecified place in unspecified non-institutional (private) residence as the place of occurrence of the external cause: Secondary | ICD-10-CM | POA: Insufficient documentation

## 2015-08-13 DIAGNOSIS — I10 Essential (primary) hypertension: Secondary | ICD-10-CM | POA: Insufficient documentation

## 2015-08-13 DIAGNOSIS — W010XXA Fall on same level from slipping, tripping and stumbling without subsequent striking against object, initial encounter: Secondary | ICD-10-CM | POA: Diagnosis not present

## 2015-08-13 DIAGNOSIS — S8991XA Unspecified injury of right lower leg, initial encounter: Secondary | ICD-10-CM | POA: Diagnosis present

## 2015-08-13 DIAGNOSIS — Z791 Long term (current) use of non-steroidal anti-inflammatories (NSAID): Secondary | ICD-10-CM | POA: Diagnosis not present

## 2015-08-13 DIAGNOSIS — M25561 Pain in right knee: Secondary | ICD-10-CM

## 2015-08-13 DIAGNOSIS — F329 Major depressive disorder, single episode, unspecified: Secondary | ICD-10-CM | POA: Diagnosis not present

## 2015-08-13 DIAGNOSIS — Y9389 Activity, other specified: Secondary | ICD-10-CM | POA: Diagnosis not present

## 2015-08-13 MED ORDER — TRAMADOL HCL 50 MG PO TABS: 50.0000 mg | ORAL_TABLET | Freq: Four times a day (QID) | ORAL | Status: DC | PRN

## 2015-08-13 MED ORDER — OXYCODONE-ACETAMINOPHEN 5-325 MG PO TABS
2.0000 | ORAL_TABLET | Freq: Once | ORAL | Status: AC
  Administered 2015-08-13: 2 via ORAL
  Filled 2015-08-13: qty 2

## 2015-08-13 NOTE — ED Notes (Signed)
Bed: WTR8 Expected date:  Expected time:  Means of arrival:  Comments: EMS- 49yo F, fall/knee pain

## 2015-08-13 NOTE — ED Notes (Signed)
Per [facility] , pt had fall today at home, pt tripped over electrical cord, fell and twisted right knee. Reports previous injury on same knee, denies surgery. Pt was abe to ambulate from home to ambulance. EMS reports tenderness upon palpation, yet no deformity or obvious injury.

## 2015-08-13 NOTE — Discharge Instructions (Signed)

## 2015-08-13 NOTE — ED Provider Notes (Signed)
CSN: [id number]     Arrival date & time 08/13/15  1009 History   First MD Initiated Contact with Patient 08/13/15 1013     Chief Complaint  Patient presents with  . Fall    knee pain     (Consider location/radiation/quality/duration/timing/severity/associated sxs/prior Treatment) HPI   Patient is a 49 year old female history of hypertension, depression and arthritis, who tripped and fell over and electrical cord at her home today and believes she fell onto her right knee causing severe pain. She was able to get up and bear some weight holding onto the furniture in her home. She called EMS and they were able to help her get out of her home and brought her to the ER further evaluation. She denies any numbness, tingling, weakness. There is no abrasion or contusion noted to her right knee and no obvious deformity  Past Medical History  Diagnosis Date  . Hypertension   . Depression   . Sleep apnea     no cpap   Past Surgical History  Procedure Laterality Date  . Appendectomy    . Tonsillectomy    . Wisdom tooth extraction    . Colonoscopy N/A 08/05/2015    Procedure: COLONOSCOPY;  Surgeon: Jerene Bears, MD;  Location: [facility];  Service: Gastroenterology;  Laterality: N/A;   Family History  Problem Relation Age of Onset  . CVA Mother   . Cerebral aneurysm Mother   . Clotting disorder Mother   . Aneurysm Maternal Grandmother   . Mental illness Sister   . Alcoholism Brother   . Throat cancer Maternal Grandfather   . Bipolar disorder Sister   . ADD / ADHD Son   . Colon cancer Neg Hx    Social History  Substance Use Topics  . Smoking status: Former Smoker    Quit date: 07/21/2005  . Smokeless tobacco: Never Used     Comment: quit 2008  . Alcohol Use: 0.0 oz/week    0 Standard drinks or equivalent per week     Comment: occasionally   OB History    Gravida Para Term Preterm AB TAB SAB Ectopic Multiple Living   3 1 1  0 2 1 1  0 0 1     Review of Systems   Constitutional: Negative.  Negative for fever, chills, diaphoresis and fatigue.  HENT: Negative.   Eyes: Negative.   Respiratory: Negative.   Cardiovascular: Negative.   Endocrine: Negative.   Genitourinary: Negative.   Musculoskeletal: Positive for arthralgias and gait problem.  Skin: Negative.   Neurological: Negative for weakness and numbness.  Psychiatric/Behavioral: Negative.       Allergies  Fenofibrate and Tylox  Home Medications   Prior to Admission medications   Medication Sig Start Date End Date Taking? Authorizing Provider  celecoxib (CELEBREX) 200 MG capsule Take 200 mg by mouth daily. For pain 03/18/15 03/17/16 Yes Historical Provider, MD  hydrochlorothiazide (HYDRODIURIL) 25 MG tablet Take 25 mg by mouth daily as needed (FOR FLUID).    Yes Historical Provider, MD  levonorgestrel (MIRENA, 52 MG,) 20 MCG/24HR IUD 20 mcg by Intrauterine route once. IUD - inserted 11/22/2011   Yes Historical Provider, MD  olmesartan (BENICAR) 20 MG tablet Take 20 mg by mouth every morning.    Yes Historical Provider, MD  Na Sulfate-K Sulfate-Mg Sulf SOLN Suprep (no substitutions)-TAKE AS DIRECTED. Patient not taking: Reported on 08/09/2015 06/09/15   Jerene Bears, MD  traMADol (ULTRAM) 50 MG tablet Take 1 tablet (50 mg total) by  mouth every 6 (six) hours as needed. 08/13/15   Delsa Grana, PA-C   BP 153/80 mmHg  Pulse 66  Temp(Src) 97.7 F (36.5 C) (Oral)  Resp 18  SpO2 100% Physical Exam  Constitutional: She is oriented to person, place, and time. She appears well-developed and well-nourished. No distress.  HENT:  Head: Normocephalic and atraumatic.  Right Ear: External ear normal.  Left Ear: External ear normal.  Nose: Nose normal.  Mouth/Throat: Oropharynx is clear and moist. No oropharyngeal exudate.  Eyes: Conjunctivae and EOM are normal. Pupils are equal, round, and reactive to light. Right eye exhibits no discharge. Left eye exhibits no discharge. No scleral icterus.  Neck:  Normal range of motion. Neck supple. No JVD present. No tracheal deviation present.  Cardiovascular: Normal rate and regular rhythm.   Pulmonary/Chest: Effort normal and breath sounds normal. No stridor. No respiratory distress.  Musculoskeletal: Normal range of motion. She exhibits tenderness. She exhibits no edema.       Right knee: She exhibits normal range of motion, no swelling, no effusion, no ecchymosis, no deformity, no erythema, normal alignment, no LCL laxity, normal patellar mobility, no bony tenderness, normal meniscus and no MCL laxity.  Negative anterior drawer test, no crepitus or effusion palpated, exam limited by body habitus  Lymphadenopathy:    She has no cervical adenopathy.  Neurological: She is alert and oriented to person, place, and time. She exhibits normal muscle tone. Coordination normal.  Skin: Skin is warm and dry. No rash noted. She is not diaphoretic. No erythema. No pallor.  Psychiatric: She has a normal mood and affect. Her behavior is normal. Judgment and thought content normal.  Nursing note and vitals reviewed.   ED Course  Procedures (including critical care time) Labs Review Labs Reviewed - No data to display  Imaging Review No results found. I have personally reviewed and evaluated these images and lab results as part of my medical decision-making.   EKG Interpretation None      MDM   Final diagnoses:  Right knee pain    Right knee pain s/p mechanical fall No fx on xray, no instability identified with exam, though pt does complain of significant pain.  Knee immobilizer and crutches ordered.  Staff was unable to apply knee immobilizer, and instead an ace wrap was applied.  Ortho f/up given for if pt did not improve with RICE tx.    Delsa Grana, PA-C 08/22/15 0139  Charlesetta Shanks, MD 09/01/15 [phone]

## 2015-12-02 DIAGNOSIS — Z9181 History of falling: Secondary | ICD-10-CM | POA: Insufficient documentation

## 2015-12-24 DIAGNOSIS — M25512 Pain in left shoulder: Secondary | ICD-10-CM | POA: Insufficient documentation

## 2016-03-09 ENCOUNTER — Other Ambulatory Visit: Payer: Self-pay

## 2016-03-09 DIAGNOSIS — Z1231 Encounter for screening mammogram for malignant neoplasm of breast: Secondary | ICD-10-CM

## 2016-04-02 ENCOUNTER — Other Ambulatory Visit: Payer: Self-pay | Admitting: Nurse Practitioner

## 2016-04-02 ENCOUNTER — Ambulatory Visit
Admission: RE | Admit: 2016-04-02 | Discharge: 2016-04-02 | Disposition: A | Discharge status: Home / Self Care | Payer: Managed Care, Other (non HMO) | Source: Ambulatory Visit

## 2016-04-02 DIAGNOSIS — Z1231 Encounter for screening mammogram for malignant neoplasm of breast: Secondary | ICD-10-CM

## 2017-03-31 ENCOUNTER — Other Ambulatory Visit: Payer: Self-pay | Admitting: Family Medicine

## 2017-03-31 DIAGNOSIS — Z1231 Encounter for screening mammogram for malignant neoplasm of breast: Secondary | ICD-10-CM

## 2017-04-14 ENCOUNTER — Ambulatory Visit
Admission: RE | Admit: 2017-04-14 | Discharge: 2017-04-14 | Disposition: A | Discharge status: Home / Self Care | Payer: No Typology Code available for payment source | Source: Ambulatory Visit | Attending: Family Medicine | Admitting: Family Medicine

## 2017-04-14 DIAGNOSIS — Z1231 Encounter for screening mammogram for malignant neoplasm of breast: Secondary | ICD-10-CM

## 2018-03-09 ENCOUNTER — Institutional Professional Consult (permissible substitution): Payer: Self-pay | Admitting: Neurology

## 2018-03-28 ENCOUNTER — Emergency Department (HOSPITAL_COMMUNITY): Payer: BLUE CROSS/BLUE SHIELD

## 2018-03-28 ENCOUNTER — Emergency Department (HOSPITAL_COMMUNITY)
Admission: EM | Admit: 2018-03-28 | Discharge: 2018-03-28 | Disposition: A | Discharge status: Home / Self Care | Payer: BLUE CROSS/BLUE SHIELD | Attending: Emergency Medicine | Admitting: Emergency Medicine

## 2018-03-28 ENCOUNTER — Encounter (HOSPITAL_COMMUNITY): Payer: Self-pay | Admitting: Emergency Medicine

## 2018-03-28 DIAGNOSIS — R900 Intracranial space-occupying lesion found on diagnostic imaging of central nervous system: Secondary | ICD-10-CM | POA: Insufficient documentation

## 2018-03-28 DIAGNOSIS — H538 Other visual disturbances: Secondary | ICD-10-CM | POA: Insufficient documentation

## 2018-03-28 DIAGNOSIS — I10 Essential (primary) hypertension: Secondary | ICD-10-CM | POA: Insufficient documentation

## 2018-03-28 DIAGNOSIS — Z79899 Other long term (current) drug therapy: Secondary | ICD-10-CM | POA: Insufficient documentation

## 2018-03-28 DIAGNOSIS — R51 Headache: Secondary | ICD-10-CM | POA: Diagnosis present

## 2018-03-28 DIAGNOSIS — Z87891 Personal history of nicotine dependence: Secondary | ICD-10-CM | POA: Insufficient documentation

## 2018-03-28 DIAGNOSIS — R519 Headache, unspecified: Secondary | ICD-10-CM

## 2018-03-28 DIAGNOSIS — G9389 Other specified disorders of brain: Secondary | ICD-10-CM

## 2018-03-28 LAB — COMPREHENSIVE METABOLIC PANEL
ALBUMIN: 3.6 g/dL (ref 3.5–5.0)
ALT: 20 U/L (ref 14–54)
AST: 15 U/L (ref 15–41)
Alkaline Phosphatase: 129 U/L — ABNORMAL HIGH (ref 38–126)
Anion gap: 11 (ref 5–15)
BILIRUBIN TOTAL: 0.5 mg/dL (ref 0.3–1.2)
BUN: 21 mg/dL — AB (ref 6–20)
CO2: 25 mmol/L (ref 22–32)
Calcium: 8.9 mg/dL (ref 8.9–10.3)
Chloride: 103 mmol/L (ref 101–111)
Creatinine, Ser: 0.56 mg/dL (ref 0.44–1.00)
GFR calc Af Amer: >60 mL/min (ref >60)
GFR calc non Af Amer: >60 mL/min (ref >60)
GLUCOSE: 176 mg/dL — AB (ref 65–99)
POTASSIUM: 4.1 mmol/L (ref 3.5–5.1)
Sodium: 139 mmol/L (ref 135–145)
TOTAL PROTEIN: 7.4 g/dL (ref 6.5–8.1)

## 2018-03-28 LAB — I-STAT BETA HCG BLOOD, ED (MC, WL, AP ONLY): I-stat hCG, quantitative: <5 m[IU]/mL (ref <5)

## 2018-03-28 LAB — CBC WITH DIFFERENTIAL/PLATELET
BASOS ABS: 0 10*3/uL (ref 0.0–0.1)
Basophils Relative: 0 %
Eosinophils Absolute: 0.2 10*3/uL (ref 0.0–0.7)
Eosinophils Relative: 2 %
HEMATOCRIT: 42 % (ref 36.0–46.0)
HEMOGLOBIN: 13.4 g/dL (ref 12.0–15.0)
Lymphocytes Relative: 24 %
Lymphs Abs: 1.9 10*3/uL (ref 0.7–4.0)
MCH: 27.2 pg (ref 26.0–34.0)
MCHC: 31.9 g/dL (ref 30.0–36.0)
MCV: 85.2 fL (ref 78.0–100.0)
MONO ABS: 0.5 10*3/uL (ref 0.1–1.0)
Monocytes Relative: 7 %
NEUTROS ABS: 5.1 10*3/uL (ref 1.7–7.7)
NEUTROS PCT: 67 %
Platelets: 232 10*3/uL (ref 150–400)
RBC: 4.93 MIL/uL (ref 3.87–5.11)
RDW: 13.6 % (ref 11.5–15.5)
WBC: 7.7 10*3/uL (ref 4.0–10.5)

## 2018-03-28 LAB — SEDIMENTATION RATE: Sed Rate: 26 mm/hr — ABNORMAL HIGH (ref 0–22)

## 2018-03-28 MED ORDER — DIPHENHYDRAMINE HCL 50 MG/ML IJ SOLN
25.0000 mg | Freq: Once | INTRAMUSCULAR | Status: AC
  Administered 2018-03-28: 25 mg via INTRAVENOUS
  Filled 2018-03-28: qty 1

## 2018-03-28 MED ORDER — PROCHLORPERAZINE EDISYLATE 10 MG/2ML IJ SOLN
10.0000 mg | Freq: Once | INTRAMUSCULAR | Status: AC
  Administered 2018-03-28: 10 mg via INTRAVENOUS
  Filled 2018-03-28: qty 2

## 2018-03-28 MED ORDER — DEXAMETHASONE SODIUM PHOSPHATE 10 MG/ML IJ SOLN
10.0000 mg | Freq: Once | INTRAMUSCULAR | Status: AC
  Administered 2018-03-28: 10 mg via INTRAVENOUS
  Filled 2018-03-28: qty 1

## 2018-03-28 MED ORDER — KETOROLAC TROMETHAMINE 30 MG/ML IJ SOLN
30.0000 mg | Freq: Once | INTRAMUSCULAR | Status: AC
  Administered 2018-03-28: 30 mg via INTRAVENOUS
  Filled 2018-03-28: qty 1

## 2018-03-28 NOTE — ED Notes (Signed)
Pt also reports "seeing spots" in the right side of her vision, as well as lightheadedness.

## 2018-03-28 NOTE — ED Provider Notes (Signed)
  Face-to-face evaluation   History: She presents for evaluation of headache which started yesterday, right-sided, and became generalized later.  She describes scotomata occurring with headache.  Pain comes and goes.  She denies trouble walking, vomiting, blurred vision, focal weakness or paresthesia.  There are no other known modifying factors.  Physical exam: Alert, calm, cooperative.  No dysarthria, or aphasia.  Normal range of motion neck.  The patient is deconditioned, and obese.  Medical decision making-nonspecific headache, with improvement after treatment in the ED.  Doubt acute intracranial bleeding, meningitis or CVA.  Medical screening examination/treatment/procedure(s) were conducted as a shared visit with non-physician practitioner(s) and myself.  I personally evaluated the patient during the encounter    Daleen Bo, MD 03/28/18 1901

## 2018-03-28 NOTE — ED Triage Notes (Signed)
Patient here from home with complaints of severe headache, "worst headache I've ever had in my left". Only on right side. Blurred vision. Ambulatory.

## 2018-03-28 NOTE — ED Provider Notes (Signed)
[facility] DEPT Provider Note   CSN: [id number] Arrival date & time: 03/28/18  0553     History   Chief Complaint Chief Complaint  Patient presents with  . Headache  . Blurred Vision    HPI RAFFAELA LADLEY is a 52 y.o. female.  HPI NAIYANA BARBIAN is a 52 y.o. female with history of hypertension and prediabetes presents to ED with complaint of a headache. States headache started gradually yesterday evening. States she did not feel well all day and when went to bed began having right sided headache. Reports could not sleep due to a headache. Pain is sharp, mainly in right side of the head to the temple and above the ear. No injuries. States she is "seeing dots in the vision." visual changes in both eyes. No n/v. No chest pain or sob. No neck pain. No fever or chills. No other complaints. Denies hx of headaches. Took motrin yesterday. Did not take any meds today   Past Medical History:  Diagnosis Date  . Depression   . Hypertension   . Sleep apnea    no cpap    Patient Active Problem List   Diagnosis Date Noted  . Screening for colon cancer   . Benign neoplasm of transverse colon   . Benign neoplasm of rectum   . Chronic respiratory infection 12/23/2014  . Cutaneous eruption 08/16/2014  . Bursitis, trochanteric 04/24/2014  . Arthralgia of hip 03/18/2014  . Essential (primary) hypertension 12/07/2013  . Corn or callus 11/19/2013  . Personal history of tobacco use, presenting hazards to health 11/19/2013  . Abnormal blood sugar 06/25/2013  . Back ache 06/25/2013  . Family history of cerebrovascular accident 06/25/2013  . Hypertriglyceridemia 06/25/2013  . Extreme obesity 06/25/2013  . Abnormal findings-skull or head 06/25/2013  . Obstructive apnea 06/25/2013  . Extremity pain 06/25/2013  . Avitaminosis D 06/25/2013    Past Surgical History:  Procedure Laterality Date  . APPENDECTOMY    . COLONOSCOPY N/A 08/05/2015   Procedure:  COLONOSCOPY;  Surgeon: Jerene Bears, MD;  Location: [facility];  Service: Gastroenterology;  Laterality: N/A;  . TONSILLECTOMY    . WISDOM TOOTH EXTRACTION       OB History    Gravida  3   Para  1   Term  1   Preterm  0   AB  2   Living  1     SAB  1   TAB  1   Ectopic  0   Multiple  0   Live Births  1            Home Medications    Prior to Admission medications   Medication Sig Start Date End Date Taking? Authorizing Provider  hydrochlorothiazide (HYDRODIURIL) 25 MG tablet Take 25 mg by mouth daily as needed (FOR FLUID).     [provider]  levonorgestrel (MIRENA, 52 MG,) 20 MCG/24HR IUD 20 mcg by Intrauterine route once. IUD - inserted 11/22/2011    [provider]  Na Sulfate-K Sulfate-Mg Sulf SOLN Suprep (no substitutions)-TAKE AS DIRECTED. Patient not taking: Reported on 08/09/2015 06/09/15   Pyrtle, Lajuan Lines, MD  olmesartan (BENICAR) 20 MG tablet Take 20 mg by mouth every morning.     [provider]  traMADol (ULTRAM) 50 MG tablet Take 1 tablet (50 mg total) by mouth every 6 (six) hours as needed. 08/13/15   Delsa Grana, PA-C    Family History Family History  Problem  Relation Age of Onset  . CVA Mother   . Cerebral aneurysm Mother   . Clotting disorder Mother   . Aneurysm Maternal Grandmother   . Mental illness Sister   . Alcoholism Brother   . Throat cancer Maternal Grandfather   . Bipolar disorder Sister   . ADD / ADHD Son   . Colon cancer Neg Hx     Social History Social History   Tobacco Use  . Smoking status: Former Smoker    Last attempt to quit: 07/21/2005    Years since quitting: 12.6  . Smokeless tobacco: Never Used  . Tobacco comment: quit 2008  Substance Use Topics  . Alcohol use: Yes    Alcohol/week: 0.0 oz    Comment: occasionally  . Drug use: No     Allergies   Fenofibrate and Tylox [oxycodone-acetaminophen]   Review of Systems Review of Systems  Constitutional: Negative for chills and  fever.  Eyes: Positive for photophobia and visual disturbance. Negative for pain.  Respiratory: Negative for cough, chest tightness and shortness of breath.   Cardiovascular: Negative for chest pain, palpitations and leg swelling.  Gastrointestinal: Negative for abdominal pain, diarrhea, nausea and vomiting.  Genitourinary: Negative for dysuria, flank pain and pelvic pain.  Musculoskeletal: Negative for arthralgias, myalgias, neck pain and neck stiffness.  Skin: Negative for rash.  Neurological: Positive for headaches. Negative for dizziness, weakness and numbness.  All other systems reviewed and are negative.    Physical Exam Updated Vital Signs BP (!) 170/95 (BP Location: Left Arm)   Pulse 64   Temp 97.7 F (36.5 C)   Resp 18   SpO2 99%   Physical Exam  Constitutional: She is oriented to person, place, and time. She appears well-developed and well-nourished. No distress.  HENT:  Head: Normocephalic.  Eyes: Pupils are equal, round, and reactive to light. Conjunctivae and EOM are normal.  Neck: Neck supple.  Cardiovascular: Normal rate, regular rhythm and normal heart sounds.  Pulmonary/Chest: Effort normal and breath sounds normal. No respiratory distress. She has no wheezes. She has no rales.  Abdominal: Soft. Bowel sounds are normal. She exhibits no distension. There is no tenderness. There is no rebound.  Musculoskeletal: She exhibits no edema.  Neurological: She is alert and oriented to person, place, and time.  5/5 and equal upper and lower extremity strength bilaterally. Equal grip strength bilaterally. Normal finger to nose and heel to shin. No pronator drift.   Skin: Skin is warm and dry.  Psychiatric: She has a normal mood and affect. Her behavior is normal.  Nursing note and vitals reviewed.    ED Treatments / Results  Labs (all labs ordered are listed, but only abnormal results are displayed) Labs Reviewed  COMPREHENSIVE METABOLIC PANEL - Abnormal; Notable for  the following components:      Result Value   Glucose, Bld 176 (*)    BUN 21 (*)    Alkaline Phosphatase 129 (*)    All other components within normal limits  SEDIMENTATION RATE - Abnormal; Notable for the following components:   Sed Rate 26 (*)    All other components within normal limits  CBC WITH DIFFERENTIAL/PLATELET  I-STAT BETA HCG BLOOD, ED ([facility], [facility], [facility] ONLY)    EKG None  Radiology Ct Head Wo Contrast  Result Date: 03/28/2018 CLINICAL DATA:  Patient with right-sided headache.  Blurred vision. EXAM: CT HEAD WITHOUT CONTRAST TECHNIQUE: Contiguous axial images were obtained from the base of the skull through the vertex without  intravenous contrast. COMPARISON:  Brain MRI 06/06/2014 FINDINGS: Brain: Ventricles and sulci are appropriate for patient's age. No evidence for acute cortically based infarct, intracranial hemorrhage, mass lesion or mass-effect. Overlying the left frontal vertex there is a 1.6 x 2.0 cm partially calcified extra-axial mass (image 25; series 2). This is increased in size from prior MRI where it measured 1.1 x 0.8 cm. Vascular: Internal carotid arterial vascular calcifications. Skull: Intact. Sinuses/Orbits: Paranasal sinuses are well aerated. Mastoid air cells are unremarkable. Orbits are unremarkable. Other: None. IMPRESSION: 1. Interval increase in size of extra-axial mass overlying the left frontal vertex when compared to prior MRI, potentially representing meningioma. The mass now measures 1.6 x 2.0 cm, previously 1.1 x 0.8 cm. Given the interval growth in size, recommend neurosurgical consultation. 2. No definite acute intracranial process. Electronically Signed   By: Lovey Newcomer M.D.   On: 03/28/2018 07:30    Procedures Procedures (including critical care time)  Medications Ordered in ED Medications  prochlorperazine (COMPAZINE) injection 10 mg (has no administration in time range)  diphenhydrAMINE (BENADRYL) injection 25 mg (has no administration in time  range)  ketorolac (TORADOL) 30 MG/ML injection 30 mg (has no administration in time range)     Initial Impression / Assessment and Plan / ED Course  I have reviewed the triage vital signs and the nursing notes.  Pertinent labs & imaging results that were available during my care of the patient were reviewed by me and considered in my medical decision making (see chart for details).     Pt with gradual onset severe headache on the right. Pain over the temple, [place] no tenderness or rashes, question temporal arteritis or even shingles, states couldn't sleep on that side. Will get labs and sed rate. CT head ordered given worst headache with no hx of the same.   9:25 AM CT scan showing growing mass in the left frontal vertex, compared to MRI of 2015.  Neurosurgery consultation recommended.  I spoke with Dr. Christella Noa who will follow up with patient in the office.  I do not think this is was causing her headache.  Her headache improved with migraine cocktail.  She states she feels much better.  Her labs unremarkable.  Her sed rate is just a tiny bit elevated, however I am not concerned about temporal arteritis at this time.  Symptoms are not consistent with intracranial bleed.  She feels well.  Her vital signs are normal.  She is stable for discharge home.  I will have her follow-up with neurosurgery and primary care doctor.  Return precautions discussed.  Vitals:   03/28/18 0608 03/28/18 0809  BP: (!) 170/95 (!) 130/56  Pulse: 64 66  Resp: 18 18  Temp: 97.7 F (36.5 C)   SpO2: 99% 100%     Final Clinical Impressions(s) / ED Diagnoses   Final diagnoses:  Bad headache  Brain mass    ED Discharge Orders    None       Jeannett Senior, PA-C 03/28/18 0930    Daleen Bo, MD 03/28/18 1901

## 2018-03-28 NOTE — Discharge Instructions (Signed)
Your CT scan showed a mass in the left side of the brain, does not appear to be cancers, however it is very importantly follow-up with neurosurgery given that it is slowly growing in size.  If headache comes back, try Excedrin Migraine.  Make sure you drink plenty of fluids, sleep well, and follow-up with family doctor for recheck of the headache and neurosurgery, Dr. Christella Noa for further evaluation of the mass.

## 2018-05-08 ENCOUNTER — Other Ambulatory Visit: Payer: Self-pay | Admitting: Nurse Practitioner

## 2018-05-08 DIAGNOSIS — Z1231 Encounter for screening mammogram for malignant neoplasm of breast: Secondary | ICD-10-CM

## 2018-06-09 ENCOUNTER — Encounter: Payer: Self-pay | Admitting: Internal Medicine

## 2018-06-15 ENCOUNTER — Ambulatory Visit
Admission: RE | Admit: 2018-06-15 | Discharge: 2018-06-15 | Disposition: A | Discharge status: Home / Self Care | Payer: BLUE CROSS/BLUE SHIELD | Source: Ambulatory Visit | Attending: Nurse Practitioner | Admitting: Nurse Practitioner

## 2018-06-15 DIAGNOSIS — Z1231 Encounter for screening mammogram for malignant neoplasm of breast: Secondary | ICD-10-CM

## 2018-09-22 DIAGNOSIS — E119 Type 2 diabetes mellitus without complications: Secondary | ICD-10-CM | POA: Insufficient documentation

## 2018-10-10 ENCOUNTER — Encounter: Payer: Self-pay | Admitting: Podiatry

## 2018-10-10 ENCOUNTER — Ambulatory Visit: Payer: Medicaid Other | Admitting: Podiatry

## 2018-10-10 ENCOUNTER — Ambulatory Visit (INDEPENDENT_AMBULATORY_CARE_PROVIDER_SITE_OTHER): Payer: Medicaid Other

## 2018-10-10 VITALS — BP 120/71

## 2018-10-10 DIAGNOSIS — M79674 Pain in right toe(s): Secondary | ICD-10-CM

## 2018-10-10 DIAGNOSIS — L600 Ingrowing nail: Secondary | ICD-10-CM | POA: Diagnosis not present

## 2018-10-10 DIAGNOSIS — B351 Tinea unguium: Secondary | ICD-10-CM

## 2018-10-10 DIAGNOSIS — S90121A Contusion of right lesser toe(s) without damage to nail, initial encounter: Secondary | ICD-10-CM

## 2018-10-10 DIAGNOSIS — E1142 Type 2 diabetes mellitus with diabetic polyneuropathy: Secondary | ICD-10-CM

## 2018-10-10 DIAGNOSIS — M79675 Pain in left toe(s): Secondary | ICD-10-CM | POA: Diagnosis not present

## 2018-10-10 DIAGNOSIS — S92911A Unspecified fracture of right toe(s), initial encounter for closed fracture: Secondary | ICD-10-CM

## 2018-10-10 NOTE — Patient Instructions (Addendum)
Diabetes and Foot Care Diabetes may cause you to have problems because of poor blood supply (circulation) to your feet and legs. This may cause the skin on your feet to become thinner, break easier, and heal more slowly. Your skin may become dry, and the skin may peel and crack. You may also have nerve damage in your legs and feet causing decreased feeling in them. You may not notice minor injuries to your feet that could lead to infections or more serious problems. Taking care of your feet is one of the most important things you can do for yourself. Follow these instructions at home:  Wear shoes at all times, even in the house. Do not go barefoot. Bare feet are easily injured.  Check your feet daily for blisters, cuts, and redness. If you cannot see the bottom of your feet, use a mirror or ask someone for help.  Wash your feet with warm water (do not use hot water) and mild soap. Then pat your feet and the areas between your toes until they are completely dry. Do not soak your feet as this can dry your skin.  Apply a moisturizing lotion or petroleum jelly (that does not contain alcohol and is unscented) to the skin on your feet and to dry, brittle toenails. Do not apply lotion between your toes.  Trim your toenails straight across. Do not dig under them or around the cuticle. File the edges of your nails with an emery board or nail file.  Do not cut corns or calluses or try to remove them with medicine.  Wear clean socks or stockings every day. Make sure they are not too tight. Do not wear knee-high stockings since they may decrease blood flow to your legs.  Wear shoes that fit properly and have enough cushioning. To break in new shoes, wear them for just a few hours a day. This prevents you from injuring your feet. Always look in your shoes before you put them on to be sure there are no objects inside.  Do not cross your legs. This may decrease the blood flow to your feet.  If you find a  minor scrape, cut, or break in the skin on your feet, keep it and the skin around it clean and dry. These areas may be cleansed with mild soap and water. Do not cleanse the area with peroxide, alcohol, or iodine.  When you remove an adhesive bandage, be sure not to damage the skin around it.  If you have a wound, look at it several times a day to make sure it is healing.  Do not use heating pads or hot water bottles. They may burn your skin. If you have lost feeling in your feet or legs, you may not know it is happening until it is too late.  Make sure your health care provider performs a complete foot exam at least annually or more often if you have foot problems. Report any cuts, sores, or bruises to your health care provider immediately. Contact a health care provider if:  You have an injury that is not healing.  You have cuts or breaks in the skin.  You have an ingrown nail.  You notice redness on your legs or feet.  You feel burning or tingling in your legs or feet.  You have pain or cramps in your legs and feet.  Your legs or feet are numb.  Your feet always feel cold. Get help right away if:  There is increasing   redness, swelling, or pain in or around a wound.  There is a red line that goes up your leg.  Pus is coming from a wound.  You develop a fever or as directed by your health care provider.  You notice a bad smell coming from an ulcer or wound. This information is not intended to replace advice given to you by your health care provider. Make sure you discuss any questions you have with your health care provider. Document Released: 10/15/2000 Document Revised: 03/25/2016 Document Reviewed: 03/27/2013 Elsevier Interactive Patient Education  2017 [place] A contusion is a deep bruise. Contusions happen when an injury causes bleeding under the skin. Symptoms of bruising include pain, swelling, and discolored skin. The skin may turn blue, purple, or  yellow. Follow these instructions at home:  Rest the injured area.  If told, put ice on the injured area. ? Put ice in a plastic bag. ? Place a towel between your skin and the bag. ? Leave the ice on for 20 minutes, 2-3 times per day.  If told, put light pressure (compression) on the injured area using an elastic bandage. Make sure the bandage is not too tight. Remove it and put it back on as told by your doctor.  If possible, raise (elevate) the injured area above the level of your heart while you are sitting or lying down.  Take over-the-counter and prescription medicines only as told by your doctor. Contact a doctor if:  Your symptoms do not get better after several days of treatment.  Your symptoms get worse.  You have trouble moving the injured area. Get help right away if:  You have very bad pain.  You have a loss of feeling (numbness) in a hand or foot.  Your hand or foot turns pale or cold. This information is not intended to replace advice given to you by your health care provider. Make sure you discuss any questions you have with your health care provider. Document Released: 04/05/2008 Document Revised: 03/25/2016 Document Reviewed: 03/05/2015 Elsevier Interactive Patient Education  2018 [employer].

## 2018-10-10 NOTE — Progress Notes (Signed)
Subjective: Kristin Reilly presents today referred by Kristin Harvey, NP with diabetes and cc of painful, discolored, thick toenails which interfere with daily activities.  Pain is aggravated when wearing enclosed shoe gear. Kristin Reilly states she has always gotten pedicures, but hasn't done so because she feels this is where she picked up the nail fungus.  She also relates chronic ingrown toenail left great toe and notes a little pus came out of the medial border of the left great toe about one week ago. She cleaned it and applied antibiotic ointment.   Patient also states she hit her right foot on a scale Friday and she thinks she may have broken her 5th toe. She has broken this toe on a couple of occasions over her life. She iced the area once, but is not a fan of buddy splinting.  Past Medical History:  Diagnosis Date  . Depression   . Hypertension   . Sleep apnea    no cpap    Patient Active Problem List   Diagnosis Date Noted  . Type 2 diabetes mellitus without complication, without long-term current use of insulin ([place]) 09/22/2018  . Shoulder pain, left 12/24/2015  . Risk for falls 12/02/2015  . Screening for colon cancer   . Benign neoplasm of transverse colon   . Benign neoplasm of rectum   . Chronic respiratory infection 12/23/2014  . Cutaneous eruption 08/16/2014  . Bursitis, trochanteric 04/24/2014  . Arthralgia of hip 03/18/2014  . Essential (primary) hypertension 12/07/2013  . Corn or callus 11/19/2013  . Personal history of tobacco use, presenting hazards to health 11/19/2013  . Abnormal blood sugar 06/25/2013  . Back ache 06/25/2013  . Family history of cerebrovascular accident 06/25/2013  . Hypertriglyceridemia 06/25/2013  . Extreme obesity 06/25/2013  . Abnormal findings-skull or head 06/25/2013  . Obstructive apnea 06/25/2013  . Extremity pain 06/25/2013  . Avitaminosis D 06/25/2013  . Nonspecific abnormal findings on radiological and examination of skull  and head 06/25/2013    Past Surgical History:  Procedure Laterality Date  . APPENDECTOMY    . COLONOSCOPY N/A 08/05/2015   Procedure: COLONOSCOPY;  Surgeon: Jerene Bears, MD;  Location: [facility];  Service: Gastroenterology;  Laterality: N/A;  . TONSILLECTOMY    . WISDOM TOOTH EXTRACTION      Current Outpatient Medications:  .  ACCU-CHEK SOFTCLIX LANCETS lancets, 1 each by Misc.(Non-Drug; Combo Route) route daily., Disp: , Rfl:  .  amitriptyline (ELAVIL) 25 MG tablet, TAKE 1-3 TABLET BY MOUTH EVERY NIGHT AT BEDTIME AS NEEDED SLEEP, Disp: , Rfl:  .  amLODipine (NORVASC) 2.5 MG tablet, Take 2.5 mg by mouth daily., Disp: , Rfl:  .  Blood Glucose Monitoring Suppl (GLUCOCOM BLOOD GLUCOSE MONITOR) DEVI, 1 each by Misc.(Non-Drug; Combo Route) route daily. Include strips. Lancets, lancet device, control solution. batteries, Disp: , Rfl:  .  celecoxib (CELEBREX) 200 MG capsule, Take 200 mg by mouth daily as needed for mild pain. , Disp: , Rfl:  .  glucose blood test strip, 1 each by Misc.(Non-Drug; Combo Route) route daily., Disp: , Rfl:  .  hydrochlorothiazide (HYDRODIURIL) 25 MG tablet, Take 25 mg by mouth daily as needed (FOR FLUID). , Disp: , Rfl:  .  levonorgestrel (MIRENA, 52 MG,) 20 MCG/24HR IUD, 20 mcg by Intrauterine route once. IUD - inserted 11/22/2011, Disp: , Rfl:  .  metFORMIN (GLUCOPHAGE-XR) 500 MG 24 hr tablet, Take by mouth., Disp: , Rfl:  .  Na Sulfate-K Sulfate-Mg Sulf SOLN,  Suprep (no substitutions)-TAKE AS DIRECTED., Disp: 354 mL, Rfl: 0 .  nystatin (MYCOSTATIN/NYSTOP) powder, Apply topically., Disp: , Rfl:  .  olmesartan (BENICAR) 40 MG tablet, Take 40 mg by mouth daily., Disp: , Rfl:  .  pravastatin (PRAVACHOL) 10 MG tablet, Take by mouth., Disp: , Rfl:  .  SUMAtriptan (IMITREX) 50 MG tablet, Take one at onset of headache, may repeat one after 2 hours. No more than 2 tabs in 24 hours., Disp: , Rfl:  .  traMADol (ULTRAM) 50 MG tablet, Take 1 tablet (50 mg total) by mouth  every 6 (six) hours as needed., Disp: 15 tablet, Rfl: 0 .  valsartan (DIOVAN) 80 MG tablet, Take by mouth., Disp: , Rfl:  .  Vitamin D, Ergocalciferol, (DRISDOL) 1.25 MG (50000 UT) CAPS capsule, Take by mouth., Disp: , Rfl:   Allergies  Allergen Reactions  . Fenofibrate Nausea And Vomiting  . Tylox [Oxycodone-Acetaminophen] Other (See Comments)    Migraine HA and GI upset    Social History   Occupational History  . Not on file  Tobacco Use  . Smoking status: Former Smoker    Last attempt to quit: 07/21/2005    Years since quitting: 13.2  . Smokeless tobacco: Never Used  . Tobacco comment: quit 2008  Substance and Sexual Activity  . Alcohol use: Yes    Alcohol/week: 0.0 standard drinks    Comment: occasionally  . Drug use: No  . Sexual activity: Yes    Birth control/protection: IUD    Family History  Problem Relation Age of Onset  . CVA Mother   . Cerebral aneurysm Mother   . Clotting disorder Mother   . Aneurysm Maternal Grandmother   . Mental illness Sister   . Alcoholism Brother   . Throat cancer Maternal Grandfather   . Bipolar disorder Sister   . ADD / ADHD Son   . Colon cancer Neg Hx   . Breast cancer Neg Hx     Immunization History  Administered Date(s) Administered  . Td 05/05/2018     Review of systems: Positive Findings in bold print.  Constitutional:  chills, fatigue, fever, sweats, weight change Communication: Optometrist, sign Ecologist, hand writing, iPad/Android device Eyes: diplopia, glare,  light sensitivity, eyeglasses, blindness Ears nose mouth throat: Hard of hearing, deaf, sign language,  vertigo,   bloody nose,  rhinitis,  cold sores, snoring Cardiovascular: HTN, edema, arrhythmia, pacemaker in place, defibrillator in place,  chest pain/tightness, chronic anticoagulation, blood clot Respiratory:  difficulty breathing, denies congestion, SOB, wheezing, cough Gastrointestinal: abdominal pain, diarrhea, nausea, vomiting,   Genitourinary:  nocturia,  pain on urination,  blood in urine, Foley catheter, urinary urgency Musculoskeletal: Uses mobility aid,  cramping, stiff joints, painful joints, hip pain right Skin: +changes in toenails, color change dryness, itchy skin, mole changes, or rash  Neurological: numbness, paresthesias, burning in feet, denies fainting,  seizure, change in speech. denies headaches, memory problems/poor historian, cerebral palsy Endocrine: diabetes, hypothyroidism, hyperthyroidism,  dry mouth, flushing, denies heat intolerance,  cold intolerance,  excessive thirst, denies polyuria,  nocturia Hematological:  easy bleeding,  excessive bleeding, easy bruising, enlarged lymph nodes, on long term blood thinner Allergy/immunological:  hive, frequent infections, multiple drug allergies, seasonal allergies,  Psychiatric:  anxiety, depression, mood disorder, suicidal ideations, hallucinations   Objective: Vascular Examination: Capillary refill time immediate x 10 digits Dorsalis pedis and posterior tibial pulses present b/l Digital hair sparse x 10 digits Skin temperature gradient WNL b/l  Dermatological Examination: Skin with normal  turgor, texture and tone b/l  Right foot significant for resolving mild edema and ecchymosis noted at MPJ of digits 3, 4, 5. Tenderness to palpation right 5th digit. She is able to move all digits. No breaks in skin.  Toenails 1-5 b/l discolored, thick, dystrophic with subungual debris and pain with palpation to nailbeds due to thickness of nails. Incurvated nailplate left great toe medial and lateral nail border with dried serosanguinous drainage. +Tenderness to palpation and nail border hypertrophy. No purulence, no erythema, no edema.  Musculoskeletal: Muscle strength 5/5 to all LE muscle groups  Neurological: Sensation intact with 10 gram monofilament Vibratory sensation diminished.  Xray Right foot shows no evidence of fracture.  Assessment: 1. Painful  onychomycosis toenails 1-5 b/l  2. Ingrown toenail medial/lateral border left great toe 3. Contusion right foot 4. NIDDM with neuropathy.  Plan: 1. Discussed diabetic foot care principles. Literature dispensed on today. 2. Xray right foot. Discussed findings. Discussed contusion and icing area once daily. 3. Toenails 1-5 b/l were debrided in length and girth without iatrogenic bleeding.Offending nail borders left great toe debrided and curretaged. Border cleansed with alcohol. Patient instructed to apply Neosporin to left great toe once daily for one week. Discussed cure rate of topicals and she doesn't want to try them. She will purchase tree tea oil OTC. 4. Patient to continue soft, supportive shoe gear 5. Patient to report any pedal injuries to medical professional immediately. 6. Follow up 3 months. Patient/POA to call should there be a concern in the interim.

## 2018-12-12 ENCOUNTER — Encounter: Payer: Self-pay | Admitting: Internal Medicine

## 2018-12-19 ENCOUNTER — Other Ambulatory Visit: Payer: Self-pay

## 2018-12-19 ENCOUNTER — Ambulatory Visit (AMBULATORY_SURGERY_CENTER): Payer: Medicaid Other

## 2018-12-19 ENCOUNTER — Telehealth: Payer: Self-pay

## 2018-12-19 VITALS — Ht 65.0 in | Wt 315.4 lb

## 2018-12-19 DIAGNOSIS — Z86010 Personal history of colonic polyps: Secondary | ICD-10-CM

## 2018-12-19 MED ORDER — NA SULFATE-K SULFATE-MG SULF 17.5-3.13-1.6 GM/177ML PO SOLN: 1.0000 | Freq: Once | ORAL | 0 refills | Status: AC

## 2018-12-19 NOTE — Telephone Encounter (Signed)
Pt came in to previsit today, Her BMI is 52, she needs to be schedule at the hospital for her colonoscopy. Can you please schedule pt and call with new appointment. Thanks PV Sharyn Lull

## 2018-12-19 NOTE — Progress Notes (Signed)
No egg or soy allergy known to patient  No issues with past sedation with any surgeries  or procedures, no intubation problems  No diet pills per patient No home 02 use per patient  No blood thinners per patient  Pt denies issues with constipation  No A fib or A flutter  EMMI video sent to pt's e mail  

## 2018-12-19 NOTE — Telephone Encounter (Signed)
Okay for outpatient colonoscopy in the hospital setting given BMI over 50

## 2018-12-26 ENCOUNTER — Telehealth: Payer: Self-pay | Admitting: Internal Medicine

## 2018-12-26 NOTE — Telephone Encounter (Signed)
Kristin Reilly this pt is calling about procedure, looks like [facility] sent you something on this 12/19/18.

## 2018-12-26 NOTE — Telephone Encounter (Signed)
Pt aware and knows to expect a call when appt available.

## 2018-12-26 NOTE — Telephone Encounter (Signed)
Pt is sched 3.3.20 colon.  Pt stated that she was informed she needed to be scheduled at [facility] due to her BMI.  Pt stated she has not received a call to resched at [facility].

## 2018-12-26 NOTE — Telephone Encounter (Signed)
Yes, I have her information. There are simply no availabilities.

## 2018-12-27 ENCOUNTER — Other Ambulatory Visit: Payer: Self-pay | Admitting: Internal Medicine

## 2018-12-27 DIAGNOSIS — Z86010 Personal history of colonic polyps: Secondary | ICD-10-CM

## 2018-12-27 NOTE — Telephone Encounter (Signed)
Patient has been scheduled for hospital colonoscopy at [facility] on 02/26/2019 at 9 am, 7:30 am arrival. I have sent patient new instructions via [employer]PS to her home address located in chart (I have confirmed with her) since she has already been given full Suprep instructions previously. She is asked to give [place] a call with any questions or concerns. She verbalizes understanding.

## 2019-01-02 ENCOUNTER — Encounter: Payer: BLUE CROSS/BLUE SHIELD | Admitting: Internal Medicine

## 2019-01-05 NOTE — Telephone Encounter (Signed)
I h ave contacted patient and have rescheduled her to colonoscopy at [facility] on 01/09/19 at 930am with 8 am arrival (she previously requested that she be informed if we had any Tuesday hospital cancellations). I have given her updated prep instruction times over the phone as well as updated instructions as to when to arrive to hospital. She verbalizes understanding.

## 2019-01-09 ENCOUNTER — Encounter (HOSPITAL_COMMUNITY)
Admission: RE | Disposition: A | Discharge status: Home / Self Care | Payer: Self-pay | Source: Home / Self Care | Attending: Internal Medicine

## 2019-01-09 ENCOUNTER — Other Ambulatory Visit: Payer: Self-pay

## 2019-01-09 ENCOUNTER — Ambulatory Visit (HOSPITAL_COMMUNITY): Payer: Medicaid Other | Admitting: Anesthesiology

## 2019-01-09 ENCOUNTER — Ambulatory Visit: Payer: Medicaid Other | Admitting: Podiatry

## 2019-01-09 ENCOUNTER — Encounter (HOSPITAL_COMMUNITY): Payer: Self-pay

## 2019-01-09 ENCOUNTER — Ambulatory Visit (HOSPITAL_COMMUNITY)
Admission: RE | Admit: 2019-01-09 | Discharge: 2019-01-09 | Disposition: A | Discharge status: Home / Self Care | Payer: Medicaid Other | Attending: Internal Medicine | Admitting: Internal Medicine

## 2019-01-09 DIAGNOSIS — Z7984 Long term (current) use of oral hypoglycemic drugs: Secondary | ICD-10-CM | POA: Diagnosis not present

## 2019-01-09 DIAGNOSIS — D120 Benign neoplasm of cecum: Secondary | ICD-10-CM | POA: Diagnosis not present

## 2019-01-09 DIAGNOSIS — E119 Type 2 diabetes mellitus without complications: Secondary | ICD-10-CM | POA: Insufficient documentation

## 2019-01-09 DIAGNOSIS — I10 Essential (primary) hypertension: Secondary | ICD-10-CM | POA: Diagnosis not present

## 2019-01-09 DIAGNOSIS — Z860100 Personal history of colon polyps, unspecified: Secondary | ICD-10-CM

## 2019-01-09 DIAGNOSIS — Z6841 Body Mass Index (BMI) 40.0 and over, adult: Secondary | ICD-10-CM | POA: Diagnosis not present

## 2019-01-09 DIAGNOSIS — Z791 Long term (current) use of non-steroidal anti-inflammatories (NSAID): Secondary | ICD-10-CM | POA: Diagnosis not present

## 2019-01-09 DIAGNOSIS — Z87891 Personal history of nicotine dependence: Secondary | ICD-10-CM | POA: Diagnosis not present

## 2019-01-09 DIAGNOSIS — K5730 Diverticulosis of large intestine without perforation or abscess without bleeding: Secondary | ICD-10-CM | POA: Insufficient documentation

## 2019-01-09 DIAGNOSIS — G4730 Sleep apnea, unspecified: Secondary | ICD-10-CM | POA: Diagnosis not present

## 2019-01-09 DIAGNOSIS — F329 Major depressive disorder, single episode, unspecified: Secondary | ICD-10-CM | POA: Insufficient documentation

## 2019-01-09 DIAGNOSIS — Z79899 Other long term (current) drug therapy: Secondary | ICD-10-CM | POA: Insufficient documentation

## 2019-01-09 DIAGNOSIS — Z1211 Encounter for screening for malignant neoplasm of colon: Secondary | ICD-10-CM | POA: Insufficient documentation

## 2019-01-09 DIAGNOSIS — D175 Benign lipomatous neoplasm of intra-abdominal organs: Secondary | ICD-10-CM | POA: Insufficient documentation

## 2019-01-09 DIAGNOSIS — E785 Hyperlipidemia, unspecified: Secondary | ICD-10-CM | POA: Diagnosis not present

## 2019-01-09 HISTORY — PX: POLYPECTOMY: SHX5525

## 2019-01-09 HISTORY — PX: COLONOSCOPY WITH PROPOFOL: SHX5780

## 2019-01-09 LAB — GLUCOSE, CAPILLARY: Glucose-Capillary: 149 mg/dL — ABNORMAL HIGH (ref 70–99)

## 2019-01-09 SURGERY — COLONOSCOPY WITH PROPOFOL
Anesthesia: Monitor Anesthesia Care

## 2019-01-09 MED ORDER — SODIUM CHLORIDE 0.9 % IV SOLN: INTRAVENOUS | Status: DC

## 2019-01-09 MED ORDER — LIDOCAINE 2% (20 MG/ML) 5 ML SYRINGE
INTRAMUSCULAR | Status: DC | PRN
  Administered 2019-01-09: 50 mg via INTRAVENOUS

## 2019-01-09 MED ORDER — PROPOFOL 500 MG/50ML IV EMUL
INTRAVENOUS | Status: DC | PRN
  Administered 2019-01-09: 100 ug/kg/min via INTRAVENOUS

## 2019-01-09 MED ORDER — PROPOFOL 10 MG/ML IV BOLUS
INTRAVENOUS | Status: DC | PRN
  Administered 2019-01-09 (×2): 20 mg via INTRAVENOUS
  Administered 2019-01-09: 50 mg via INTRAVENOUS
  Administered 2019-01-09: 20 mg via INTRAVENOUS

## 2019-01-09 MED ORDER — LACTATED RINGERS IV SOLN
INTRAVENOUS | Status: DC
  Administered 2019-01-09: 1000 mL via INTRAVENOUS

## 2019-01-09 MED ORDER — PROPOFOL 10 MG/ML IV BOLUS
INTRAVENOUS | Status: AC
  Filled 2019-01-09: qty 40

## 2019-01-09 MED ORDER — PHENYLEPHRINE 40 MCG/ML (10ML) SYRINGE FOR IV PUSH (FOR BLOOD PRESSURE SUPPORT)
PREFILLED_SYRINGE | INTRAVENOUS | Status: DC | PRN
  Administered 2019-01-09 (×2): 100 ug via INTRAVENOUS

## 2019-01-09 SURGICAL SUPPLY — 22 items

## 2019-01-09 NOTE — Discharge Instructions (Signed)
YOU HAD AN ENDOSCOPIC PROCEDURE TODAY: Refer to the procedure report and other information in the discharge instructions given to you for any specific questions about what was found during the examination. If this information does not answer your questions, please call [place] office at [phone] to clarify.  ° °YOU SHOULD EXPECT: Some feelings of bloating in the abdomen. Passage of more gas than usual. Walking can help get rid of the air that was put into your GI tract during the procedure and reduce the bloating. If you had a lower endoscopy (such as a colonoscopy or flexible sigmoidoscopy) you may notice spotting of blood in your stool or on the toilet paper. Some abdominal soreness may be present for a day or two, also. ° °DIET: Your first meal following the procedure should be a light meal and then it is ok to progress to your normal diet. A half-sandwich or bowl of soup is an example of a good first meal. Heavy or fried foods are harder to digest and may make you feel nauseous or bloated. Drink plenty of fluids but you should avoid alcoholic beverages for 24 hours. If you had a esophageal dilation, please see attached instructions for diet.   ° °ACTIVITY: Your care partner should take you home directly after the procedure. You should plan to take it easy, moving slowly for the rest of the day. You can resume normal activity the day after the procedure however YOU SHOULD NOT DRIVE, use power tools, machinery or perform tasks that involve climbing or major physical exertion for 24 hours (because of the sedation medicines used during the test).  ° °SYMPTOMS TO REPORT IMMEDIATELY: °A gastroenterologist can be reached at any hour. Please call [phone]  for any of the following symptoms:  °Following lower endoscopy (colonoscopy, flexible sigmoidoscopy) °Excessive amounts of blood in the stool  °Significant tenderness, worsening of abdominal pains  °Swelling of the abdomen that is new, acute  °Fever of 100° or  higher  °Following upper endoscopy (EGD, EUS, ERCP, esophageal dilation) °Vomiting of blood or coffee ground material  °New, significant abdominal pain  °New, significant chest pain or pain under the shoulder blades  °Painful or persistently difficult swallowing  °New shortness of breath  °Black, tarry-looking or red, bloody stools ° °FOLLOW UP:  °If any biopsies were taken you will be contacted by phone or by letter within the next 1-3 weeks. Call [phone]  if you have not heard about the biopsies in 3 weeks.  °Please also call with any specific questions about appointments or follow up tests. ° °

## 2019-01-09 NOTE — Anesthesia Preprocedure Evaluation (Signed)
Anesthesia Evaluation  Patient identified by MRN, date of birth, ID band Patient awake    Reviewed: Allergy & Precautions, NPO status , Patient's Chart, lab work & pertinent test results  Airway Mallampati: II  TM Distance: <3 FB Neck ROM: Full    Dental no notable dental hx.    Pulmonary sleep apnea , former smoker,    breath sounds clear to auscultation + decreased breath sounds      Cardiovascular hypertension, negative cardio ROS Normal cardiovascular exam Rhythm:Regular Rate:Normal     Neuro/Psych negative neurological ROS  negative psych ROS   GI/Hepatic negative GI ROS, Neg liver ROS,   Endo/Other  diabetesMorbid obesity  Renal/GU negative Renal ROS  negative genitourinary   Musculoskeletal negative musculoskeletal ROS (+)   Abdominal (+) + obese,   Peds negative pediatric ROS (+)  Hematology negative hematology ROS (+)   Anesthesia Other Findings   Reproductive/Obstetrics negative OB ROS                             Anesthesia Physical Anesthesia Plan  ASA: III  Anesthesia Plan: MAC   Post-op Pain Management:    Induction: Intravenous  PONV Risk Score and Plan: 0  Airway Management Planned: Simple Face Mask  Additional Equipment:   Intra-op Plan:   Post-operative Plan:   Informed Consent: I have reviewed the patients History and Physical, chart, labs and discussed the procedure including the risks, benefits and alternatives for the proposed anesthesia with the patient or authorized representative who has indicated his/her understanding and acceptance.     Dental advisory given  Plan Discussed with: CRNA and Surgeon  Anesthesia Plan Comments:         Anesthesia Quick Evaluation

## 2019-01-09 NOTE — Anesthesia Postprocedure Evaluation (Signed)
Anesthesia Post Note  Patient: Kristin Reilly  Procedure(s) Performed: COLONOSCOPY WITH PROPOFOL (N/A ) BIOPSY     Patient location during evaluation: PACU Anesthesia Type: MAC Level of consciousness: awake and alert Pain management: pain level controlled Vital Signs Assessment: post-procedure vital signs reviewed and stable Respiratory status: spontaneous breathing, nonlabored ventilation, respiratory function stable and patient connected to nasal cannula oxygen Cardiovascular status: stable and blood pressure returned to baseline Postop Assessment: no apparent nausea or vomiting Anesthetic complications: no    Last Vitals:  Vitals:   01/09/19 1110 01/09/19 1120  BP: 111/64 124/80  Pulse: 66 67  Resp: 18 20  Temp:    SpO2: 100% 99%    Last Pain:  Vitals:   01/09/19 1127  TempSrc:   PainSc: 0-No pain                 Lorine Iannaccone S

## 2019-01-09 NOTE — H&P (Signed)
HPI: Kristin Reilly is a 53 year old female with a history of hypertension, hyperlipidemia, diabetes, sleep apnea and morbid obesity who presents for outpatient surveillance colonoscopy.  She has a history of a 10 mm adenoma removed in October 2016.  She is been doing well.  Working on her blood sugars.  Lost 38 pounds in the last for 5 months by diet and exercise.  Goal is 100 pounds this year  Bowels have been regular without blood or melena.  Past Medical History:  Diagnosis Date  . Arthritis   . Depression   . Glaucoma   . Hyperlipidemia   . Hypertension   . Sleep apnea    no cpap    Past Surgical History:  Procedure Laterality Date  . APPENDECTOMY    . COLONOSCOPY N/A 08/05/2015   Procedure: COLONOSCOPY;  Surgeon: Jerene Bears, MD;  Location: [facility];  Service: Gastroenterology;  Laterality: N/A;  . TONSILLECTOMY    . WISDOM TOOTH EXTRACTION      (Not in an outpatient encounter)   Allergies  Allergen Reactions  . Fenofibrate Nausea And Vomiting  . Tylox [Oxycodone-Acetaminophen] Other (See Comments)    Migraine HA and GI upset    Family History  Problem Relation Age of Onset  . CVA Mother   . Cerebral aneurysm Mother   . Clotting disorder Mother   . Aneurysm Maternal Grandmother   . Mental illness Sister   . Alcoholism Brother   . Throat cancer Maternal Grandfather   . Bipolar disorder Sister   . ADD / ADHD Son   . Colon cancer Neg Hx   . Breast cancer Neg Hx   . Esophageal cancer Neg Hx   . Rectal cancer Neg Hx   . Stomach cancer Neg Hx     Social History   Tobacco Use  . Smoking status: Former Smoker    Last attempt to quit: 07/21/2005    Years since quitting: 13.4  . Smokeless tobacco: Never Used  . Tobacco comment: quit 2008  Substance Use Topics  . Alcohol use: Yes    Alcohol/week: 0.0 standard drinks    Comment: occasionally  . Drug use: No    ROS: As per history of present illness, otherwise negative  BP 140/64   Pulse 74    Temp 97.8 F (36.6 C) (Oral)   Resp 16   SpO2 98%  Gen: awake, alert, NAD HEENT: anicteric, op clear CV: RRR, no mrg Pulm: CTA b/l Abd: soft, obese, NT/ND, +BS throughout Ext: no c/c/e Neuro: nonfocal   RELEVANT LABS AND IMAGING: CBC    Component Value Date/Time   WBC 7.7 03/28/2018 0631   RBC 4.93 03/28/2018 0631   HGB 13.4 03/28/2018 0631   HCT 42.0 03/28/2018 0631   PLT 232 03/28/2018 0631   MCV 85.2 03/28/2018 0631   MCH 27.2 03/28/2018 0631   MCHC 31.9 03/28/2018 0631   RDW 13.6 03/28/2018 0631   LYMPHSABS 1.9 03/28/2018 0631   MONOABS 0.5 03/28/2018 0631   EOSABS 0.2 03/28/2018 0631   BASOSABS 0.0 03/28/2018 0631    CMP     Component Value Date/Time   NA 139 03/28/2018 0631   K 4.1 03/28/2018 0631   CL 103 03/28/2018 0631   CO2 25 03/28/2018 0631   GLUCOSE 176 (H) 03/28/2018 0631   BUN 21 (H) 03/28/2018 0631   CREATININE 0.56 03/28/2018 0631   CALCIUM 8.9 03/28/2018 0631   PROT 7.4 03/28/2018 0631   ALBUMIN 3.6 03/28/2018 0631  AST 15 03/28/2018 0631   ALT 20 03/28/2018 0631   ALKPHOS 129 (H) 03/28/2018 0631   BILITOT 0.5 03/28/2018 0631   GFRNONAA >60 03/28/2018 0631   GFRAA >60 03/28/2018 0631    ASSESSMENT/PLAN: 53 year old female with a history of hypertension, hyperlipidemia, diabetes, sleep apnea and morbid obesity who presents for outpatient surveillance colonoscopy.   1.  Personal history of adenomatous colon polyp --surveillance colonoscopy due at this time based on 10 mm adenoma removed in October 2016.  Monitored anesthesia care given BMI greater than 50. The nature of the procedure, as well as the risks, benefits, and alternatives were carefully and thoroughly reviewed with the patient. Ample time for discussion and questions allowed. The patient understood, was satisfied, and agreed to proceed.

## 2019-01-09 NOTE — Op Note (Signed)
[facility] Patient Name: Kristin Reilly Procedure Date: 01/09/2019 MRN: [id number] Attending MD: Jerene Bears , MD Date of Birth: [birth year] CSN: [id number] Age: 53 Admit Type: Outpatient Procedure:                Colonoscopy Indications:              High risk colon cancer surveillance: Personal                            history of adenoma (10 mm or greater in size), Last                            colonoscopy: October 2016 Providers:                Lajuan Lines. Hilarie Fredrickson, MD, Cleda Daub, RN, William Dalton, Technician Referring MD:             Berkley Harvey Medicines:                Monitored Anesthesia Care Complications:            No immediate complications. Estimated Blood Loss:     Estimated blood loss was minimal. Procedure:                Pre-Anesthesia Assessment:                           - Prior to the procedure, a History and Physical                            was performed, and patient medications and                            allergies were reviewed. The patient's tolerance of                            previous anesthesia was also reviewed. The risks                            and benefits of the procedure and the sedation                            options and risks were discussed with the patient.                            All questions were answered, and informed consent                            was obtained. Prior Anticoagulants: The patient has                            taken no previous anticoagulant or antiplatelet  agents. ASA Grade Assessment: III - A patient with                            severe systemic disease. After reviewing the risks                            and benefits, the patient was deemed in                            satisfactory condition to undergo the procedure.                           After obtaining informed consent, the colonoscope                            was  passed under direct vision. Throughout the                            procedure, the patient's blood pressure, pulse, and                            oxygen saturations were monitored continuously. The                            CF-HQ190L ([id number] Olympus colonoscope was                            introduced through the anus and advanced to the                            cecum, identified by appendiceal orifice and                            ileocecal valve. The colonoscopy was performed                            without difficulty. The patient tolerated the                            procedure well. The quality of the bowel                            preparation was good. The ileocecal valve,                            appendiceal orifice, and rectum were photographed. Scope In: 10:32:12 AM Scope Out: 10:51:32 AM Scope Withdrawal Time: 0 hours 15 minutes 53 seconds  Total Procedure Duration: 0 hours 19 minutes 20 seconds  Findings:      The digital rectal exam was normal.      A 6 mm polyp was found in the cecum. The polyp was sessile. The polyp       was removed with a cold snare. Resection and retrieval were complete.      The ileocecal valve was moderately lipomatous.  Multiple small and large-mouthed diverticula were found in the sigmoid       colon, descending colon, ascending colon and cecum.      The retroflexed view of the distal rectum and anal verge was normal and       showed no anal or rectal abnormalities. Impression:               - One 6 mm polyp in the cecum, removed with a cold                            snare. Resected and retrieved.                           - Lipomatous ileocecal valve.                           - Diverticulosis in the sigmoid colon, in the                            descending colon, in the ascending colon and in the                            cecum.                           - The distal rectum and anal verge are normal on                             retroflexion view. Moderate Sedation:      N/A Recommendation:           - Patient has a contact number available for                            emergencies. The signs and symptoms of potential                            delayed complications were discussed with the                            patient. Return to normal activities tomorrow.                            Written discharge instructions were provided to the                            patient.                           - Resume previous diet.                           - Continue present medications.                           - Await pathology results.                           -  Repeat colonoscopy is recommended for                            surveillance. The colonoscopy date will be                            determined after pathology results from today's                            exam become available for review. Procedure Code(s):        --- Professional ---                           [phone], Colonoscopy, flexible; with removal of                            tumor(s), polyp(s), or other lesion(s) by snare                            technique Diagnosis Code(s):        --- Professional ---                           Z86.010, Personal history of colonic polyps                           D12.0, Benign neoplasm of cecum                           K63.89, Other specified diseases of intestine                           K57.30, Diverticulosis of large intestine without                            perforation or abscess without bleeding CPT copyright 2018 American [employer] Association. All rights reserved. The codes documented in this report are preliminary and upon coder review may  be revised to meet current compliance requirements. Jerene Bears, MD 01/09/2019 11:00:32 AM This report has been signed electronically. Number of Addenda: 0

## 2019-01-09 NOTE — Transfer of Care (Signed)
Immediate Anesthesia Transfer of Care Note  Patient: Kristin Reilly  Procedure(s) Performed: Procedure(s): COLONOSCOPY WITH PROPOFOL (N/A) BIOPSY  Patient Location: PACU  Anesthesia Type:MAC  Level of Consciousness:  sedated, patient cooperative and responds to stimulation  Airway & Oxygen Therapy:Patient Spontanous Breathing and Patient connected to face mask oxgen  Post-op Assessment:  Report given to PACU RN and Post -op Vital signs reviewed and stable  Post vital signs:  Reviewed and stable  Last Vitals:  Vitals:   01/09/19 0836  BP: 140/64  Pulse: 74  Resp: 16  Temp: 36.6 C  SpO2: 72%    Complications: No apparent anesthesia complications

## 2019-01-10 ENCOUNTER — Encounter: Payer: Self-pay | Admitting: Internal Medicine

## 2019-01-26 ENCOUNTER — Telehealth: Payer: Self-pay | Admitting: Family Medicine

## 2019-01-26 NOTE — Telephone Encounter (Signed)
Left patient a detailed message about her rsch appt due to Covid-19

## 2019-01-30 ENCOUNTER — Encounter: Payer: Medicaid Other | Admitting: Advanced Practice Midwife

## 2019-02-01 ENCOUNTER — Ambulatory Visit: Payer: Medicaid Other | Admitting: Podiatry

## 2019-02-26 ENCOUNTER — Telehealth: Payer: Self-pay | Admitting: Advanced Practice Midwife

## 2019-02-26 NOTE — Telephone Encounter (Signed)
The patient stated she wanted to be sure her appointment has not changed again for tomorrow. She became very upset due to the cancellation stated no one called her. She also stated she can put on gloves and face mask and doctor can removed the IUD . She stated her insurance is going to run out in June and she needs to appointment before than. Gave the patient to the option for the health department in the event her insurance cancels before we can see her.

## 2019-02-28 ENCOUNTER — Encounter: Payer: Medicaid Other | Admitting: Medical

## 2019-04-13 ENCOUNTER — Telehealth: Payer: Self-pay | Admitting: Advanced Practice Midwife

## 2019-04-13 NOTE — Telephone Encounter (Signed)
Patient was called, and a MyChart link sent to her e-mail.

## 2019-04-24 ENCOUNTER — Ambulatory Visit (INDEPENDENT_AMBULATORY_CARE_PROVIDER_SITE_OTHER): Payer: Medicaid Other | Admitting: Advanced Practice Midwife

## 2019-04-24 ENCOUNTER — Other Ambulatory Visit: Payer: Self-pay

## 2019-04-24 ENCOUNTER — Other Ambulatory Visit (HOSPITAL_COMMUNITY)
Admission: RE | Admit: 2019-04-24 | Discharge: 2019-04-24 | Disposition: A | Discharge status: Home / Self Care | Payer: Medicaid Other | Source: Ambulatory Visit | Attending: Advanced Practice Midwife | Admitting: Advanced Practice Midwife

## 2019-04-24 VITALS — BP 157/84 | HR 87 | Temp 98.3°F | Ht 66.0 in | Wt 302.6 lb

## 2019-04-24 DIAGNOSIS — Z124 Encounter for screening for malignant neoplasm of cervix: Secondary | ICD-10-CM | POA: Insufficient documentation

## 2019-04-24 DIAGNOSIS — B373 Candidiasis of vulva and vagina: Secondary | ICD-10-CM | POA: Diagnosis not present

## 2019-04-24 DIAGNOSIS — Z30432 Encounter for removal of intrauterine contraceptive device: Secondary | ICD-10-CM

## 2019-04-24 DIAGNOSIS — B3731 Acute candidiasis of vulva and vagina: Secondary | ICD-10-CM

## 2019-04-24 DIAGNOSIS — Z0000 Encounter for general adult medical examination without abnormal findings: Secondary | ICD-10-CM | POA: Diagnosis present

## 2019-04-24 NOTE — Patient Instructions (Signed)
Menopause Menopause is the normal time of life when menstrual periods stop completely. It is usually confirmed by 12 months without a menstrual period. The transition to menopause (perimenopause) most often happens between the ages of 72 and 56. During perimenopause, hormone levels change in your body, which can cause symptoms and affect your health. Menopause may increase your risk for:  Loss of bone (osteoporosis), which causes bone breaks (fractures).  Depression.  Hardening and narrowing of the arteries (atherosclerosis), which can cause heart attacks and strokes. What are the causes? This condition is usually caused by a natural change in hormone levels that happens as you get older. The condition may also be caused by surgery to remove both ovaries (bilateral oophorectomy). What increases the risk? This condition is more likely to start at an earlier age if you have certain medical conditions or treatments, including:  A tumor of the pituitary gland in the brain.  A disease that affects the ovaries and hormone production.  Radiation treatment for cancer.  Certain cancer treatments, such as chemotherapy or hormone (anti-estrogen) therapy.  Heavy smoking and excessive alcohol use.  Family history of early menopause. This condition is also more likely to develop earlier in women who are very thin. What are the signs or symptoms? Symptoms of this condition include:  Hot flashes.  Irregular menstrual periods.  Night sweats.  Changes in feelings about sex. This could be a decrease in sex drive or an increased comfort around your sexuality.  Vaginal dryness and thinning of the vaginal walls. This may cause painful intercourse.  Dryness of the skin and development of wrinkles.  Headaches.  Problems sleeping (insomnia).  Mood swings or irritability.  Memory problems.  Weight gain.  Hair growth on the face and chest.  Bladder infections or problems with urinating. How  is this diagnosed? This condition is diagnosed based on your medical history, a physical exam, your age, your menstrual history, and your symptoms. Hormone tests may also be done. How is this treated? In some cases, no treatment is needed. You and your health care provider should make a decision together about whether treatment is necessary. Treatment will be based on your individual condition and preferences. Treatment for this condition focuses on managing symptoms. Treatment may include:  Menopausal hormone therapy (MHT).  Medicines to treat specific symptoms or complications.  Acupuncture.  Vitamin or herbal supplements. Before starting treatment, make sure to let your health care provider know if you have a personal or family history of:  Heart disease.  Breast cancer.  Blood clots.  Diabetes.  Osteoporosis. Follow these instructions at home: Lifestyle  Do not use any products that contain nicotine or tobacco, such as cigarettes and e-cigarettes. If you need help quitting, ask your health care provider.  Get at least 30 minutes of physical activity on 5 or more days each week.  Avoid alcoholic and caffeinated beverages, as well as spicy foods. This may help prevent hot flashes.  Get 7-8 hours of sleep each night.  If you have hot flashes, try: ? Dressing in layers. ? Avoiding things that may trigger hot flashes, such as spicy food, warm places, or stress. ? Taking slow, deep breaths when a hot flash starts. ? Keeping a fan in your home and office.  Find ways to manage stress, such as deep breathing, meditation, or journaling.  Consider going to group therapy with other women who are having menopause symptoms. Ask your health care provider about recommended group therapy meetings. Eating and  drinking  Eat a healthy, balanced diet that contains whole grains, lean protein, low-fat dairy, and plenty of fruits and vegetables.  Your health care provider may recommend  adding more soy to your diet. Foods that contain soy include tofu, tempeh, and soy milk.  Eat plenty of foods that contain calcium and vitamin D for bone health. Items that are rich in calcium include low-fat milk, yogurt, beans, almonds, sardines, broccoli, and kale. Medicines  Take over-the-counter and prescription medicines only as told by your health care provider.  Talk with your health care provider before starting any herbal supplements. If prescribed, take vitamins and supplements as told by your health care provider. These may include: ? Calcium. Women age 48 and older should get 1,200 mg (milligrams) of calcium every day. ? Vitamin D. Women need 600-800 International Units of vitamin D each day. ? Vitamins B12 and B6. Aim for 50 micrograms of B12 and 1.5 mg of B6 each day. General instructions  Keep track of your menstrual periods, including: ? When they occur. ? How heavy they are and how long they last. ? How much time passes between periods.  Keep track of your symptoms, noting when they start, how often you have them, and how long they last.  Use vaginal lubricants or moisturizers to help with vaginal dryness and improve comfort during sex.  Keep all follow-up visits as told by your health care provider. This is important. This includes any group therapy or counseling. Contact a health care provider if:  You are still having menstrual periods after age 36.  You have pain during sex.  You have not had a period for 12 months and you develop vaginal bleeding. Get help right away if:  You have: ? Severe depression. ? Excessive vaginal bleeding. ? Pain when you urinate. ? A fast or irregular heart beat (palpitations). ? Severe headaches. ? Abdomen (abdominal) pain or severe indigestion.  You fell and you think you have a broken bone.  You develop leg or chest pain.  You develop vision problems.  You feel a lump in your breast. Summary  Menopause is the normal  time of life when menstrual periods stop completely. It is usually confirmed by 12 months without a menstrual period.  The transition to menopause (perimenopause) most often happens between the ages of 74 and 12.  Symptoms can be managed through medicines, lifestyle changes, and complementary therapies such as acupuncture.  Eat a balanced diet that is rich in nutrients to promote bone health and heart health and to manage symptoms during menopause. This information is not intended to replace advice given to you by your health care provider. Make sure you discuss any questions you have with your health care provider. Document Released: 01/08/2004 Document Revised: 11/20/2016 Document Reviewed: 11/20/2016 Elsevier Interactive Patient Education  2019 [place] Years, Female Preventive care refers to lifestyle choices and visits with your health care provider that can promote health and wellness. What does preventive care include?   A yearly physical exam. This is also called an annual well check.  Dental exams once or twice a year.  Routine eye exams. Ask your health care provider how often you should have your eyes checked.  Personal lifestyle choices, including: ? Daily care of your teeth and gums. ? Regular physical activity. ? Eating a healthy diet. ? Avoiding tobacco and drug use. ? Limiting alcohol use. ? Practicing safe sex. ? Taking low-dose aspirin daily starting at age  50. ? Taking vitamin and mineral supplements as recommended by your health care provider. What happens during an annual well check? The services and screenings done by your health care provider during your annual well check will depend on your age, overall health, lifestyle risk factors, and family history of disease. Counseling Your health care provider may ask you questions about your:  Alcohol use.  Tobacco use.  Drug use.  Emotional well-being.  Home and relationship  well-being.  Sexual activity.  Eating habits.  Work and work Statistician.  Method of birth control.  Menstrual cycle.  Pregnancy history. Screening You may have the following tests or measurements:  Height, weight, and BMI.  Blood pressure.  Lipid and cholesterol levels. These may be checked every 5 years, or more frequently if you are over 50 years old.  Skin check.  Lung cancer screening. You may have this screening every year starting at age 70 if you have a 30-pack-year history of smoking and currently smoke or have quit within the past 15 years.  Colorectal cancer screening. All adults should have this screening starting at age 42 and continuing until age 63. Your health care provider may recommend screening at age 59. You will have tests every 1-10 years, depending on your results and the type of screening test. People at increased risk should start screening at an earlier age. Screening tests may include: ? Guaiac-based fecal occult blood testing. ? Fecal immunochemical test (FIT). ? Stool DNA test. ? Virtual colonoscopy. ? Sigmoidoscopy. During this test, a flexible tube with a tiny camera (sigmoidoscope) is used to examine your rectum and lower colon. The sigmoidoscope is inserted through your anus into your rectum and lower colon. ? Colonoscopy. During this test, a long, thin, flexible tube with a tiny camera (colonoscope) is used to examine your entire colon and rectum.  Hepatitis C blood test.  Hepatitis B blood test.  Sexually transmitted disease (STD) testing.  Diabetes screening. This is done by checking your blood sugar (glucose) after you have not eaten for a while (fasting). You may have this done every 1-3 years.  Mammogram. This may be done every 1-2 years. Talk to your health care provider about when you should start having regular mammograms. This may depend on whether you have a family history of breast cancer.  BRCA-related cancer screening. This  may be done if you have a family history of breast, ovarian, tubal, or peritoneal cancers.  Pelvic exam and Pap test. This may be done every 3 years starting at age 35. Starting at age 63, this may be done every 5 years if you have a Pap test in combination with an HPV test.  Bone density scan. This is done to screen for osteoporosis. You may have this scan if you are at high risk for osteoporosis. Discuss your test results, treatment options, and if necessary, the need for more tests with your health care provider. Vaccines Your health care provider may recommend certain vaccines, such as:  Influenza vaccine. This is recommended every year.  Tetanus, diphtheria, and acellular pertussis (Tdap, Td) vaccine. You may need a Td booster every 10 years.  Varicella vaccine. You may need this if you have not been vaccinated.  Zoster vaccine. You may need this after age 25.  Measles, mumps, and rubella (MMR) vaccine. You may need at least one dose of MMR if you were born in 1957 or later. You may also need a second dose.  Pneumococcal 13-valent conjugate (PCV13) vaccine. You  may need this if you have certain conditions and were not previously vaccinated.  Pneumococcal polysaccharide (PPSV23) vaccine. You may need one or two doses if you smoke cigarettes or if you have certain conditions.  Meningococcal vaccine. You may need this if you have certain conditions.  Hepatitis A vaccine. You may need this if you have certain conditions or if you travel or work in places where you may be exposed to hepatitis A.  Hepatitis B vaccine. You may need this if you have certain conditions or if you travel or work in places where you may be exposed to hepatitis B.  Haemophilus influenzae type b (Hib) vaccine. You may need this if you have certain conditions. Talk to your health care provider about which screenings and vaccines you need and how often you need them. This information is not intended to replace  advice given to you by your health care provider. Make sure you discuss any questions you have with your health care provider. Document Released: 11/14/2015 Document Revised: 12/08/2017 Document Reviewed: 08/19/2015 Elsevier Interactive Patient Education  2019 [employer].

## 2019-04-24 NOTE — Progress Notes (Signed)
GYNECOLOGY ANNUAL PREVENTATIVE CARE ENCOUNTER NOTE  History:     Kristin Reilly is a 53 y.o. [phone] female here for a routine annual gynecologic exam.  Current complaints: hot flashes.   Denies abnormal vaginal bleeding, discharge, pelvic pain, problems with intercourse or other gynecologic concerns.  Has joined [employer] and lost 40 pounds.    Gynecologic History No LMP recorded. (Menstrual status: IUD). Has not had any periods since insertion of IUD in 2013.  Contraception: abstinence Last Pap: 2016. Results were: normal with negative HPV Last mammogram: 2019. Results were: normal  Obstetric History OB History  Gravida Para Term Preterm AB Living  3 1 1  0 2 1  SAB TAB Ectopic Multiple Live Births  1 1 0 0 1    # Outcome Date GA Lbr Len/2nd Weight Sex Delivery Anes PTL Lv  3 SAB 2003          2 Term 2002 [redacted]w[redacted]d   M Vag-Spont EPI  LIV  1 TAB 1985            Past Medical History:  Diagnosis Date  . Arthritis   . Depression   . Glaucoma   . Hyperlipidemia   . Hypertension   . Sleep apnea    no cpap    Past Surgical History:  Procedure Laterality Date  . APPENDECTOMY    . COLONOSCOPY N/A 08/05/2015   Procedure: COLONOSCOPY;  Surgeon: Jerene Bears, MD;  Location: [facility];  Service: Gastroenterology;  Laterality: N/A;  . COLONOSCOPY WITH PROPOFOL N/A 01/09/2019   Procedure: COLONOSCOPY WITH PROPOFOL;  Surgeon: Jerene Bears, MD;  Location: [facility];  Service: Gastroenterology;  Laterality: N/A;  . POLYPECTOMY  01/09/2019   Procedure: POLYPECTOMY;  Surgeon: Jerene Bears, MD;  Location: [facility];  Service: Gastroenterology;;  . TONSILLECTOMY    . WISDOM TOOTH EXTRACTION      Current Outpatient Medications on File Prior to Visit  Medication Sig Dispense Refill  . ACCU-CHEK SOFTCLIX LANCETS lancets 1 each by Misc.(Non-Drug; Combo Route) route daily.    Marland Kitchen amitriptyline (ELAVIL) 25 MG tablet Take 25 mg by mouth at bedtime as needed for sleep.     Marland Kitchen  amLODipine (NORVASC) 2.5 MG tablet Take 2.5 mg by mouth daily.    . Blood Glucose Monitoring Suppl (GLUCOCOM BLOOD GLUCOSE MONITOR) DEVI 1 each by Misc.(Non-Drug; Combo Route) route daily. Include strips. Lancets, lancet device, control solution. batteries    . celecoxib (CELEBREX) 200 MG capsule Take 200 mg by mouth daily as needed for mild pain.     . cholecalciferol (VITAMIN D3) 25 MCG (1000 UT) tablet Take 1,000 Units by mouth daily.    Marland Kitchen glucose blood test strip 1 each by Misc.(Non-Drug; Combo Route) route daily.    . hydrochlorothiazide (HYDRODIURIL) 25 MG tablet Take 25 mg by mouth daily as needed (FOR FLUID).     Marland Kitchen levonorgestrel (MIRENA, 52 MG,) 20 MCG/24HR IUD 20 mcg by Intrauterine route once. IUD - inserted 11/22/2011    . metFORMIN (GLUCOPHAGE-XR) 500 MG 24 hr tablet Take 500 mg by mouth 2 (two) times daily with a meal.     . nystatin (MYCOSTATIN/NYSTOP) powder Apply 1 Bottle topically daily as needed (irritation).     . pravastatin (PRAVACHOL) 10 MG tablet Take 20 mg by mouth daily.     . valsartan (DIOVAN) 80 MG tablet Take 80 mg by mouth daily.     . vitamin C (ASCORBIC ACID) 500 MG tablet Take  500 mg by mouth daily.     No current facility-administered medications on file prior to visit.     Allergies  Allergen Reactions  . Fenofibrate Nausea And Vomiting  . Tylox [Oxycodone-Acetaminophen] Other (See Comments)    Migraine HA and GI upset    Social History:  reports that she quit smoking about 13 years ago. She has never used smokeless tobacco. She reports current alcohol use. She reports that she does not use drugs.  Family History  Problem Relation Age of Onset  . CVA Mother   . Cerebral aneurysm Mother   . Clotting disorder Mother   . Aneurysm Maternal Grandmother   . Mental illness Sister   . Alcoholism Brother   . Throat cancer Maternal Grandfather   . Bipolar disorder Sister   . ADD / ADHD Son   . Colon cancer Neg Hx   . Breast cancer Neg Hx   . Esophageal  cancer Neg Hx   . Rectal cancer Neg Hx   . Stomach cancer Neg Hx     The following portions of the patient's history were reviewed and updated as appropriate: allergies, current medications, past family history, past medical history, past social history, past surgical history and problem list.  Review of Systems Review of Systems  Constitutional: Negative for chills, fever and unexpected weight change.  Gastrointestinal: Negative for abdominal pain.  Endocrine:       Hot flashes  Genitourinary: Negative for dysuria, menstrual problem, pelvic pain, vaginal bleeding and vaginal discharge.  Psychiatric/Behavioral: Negative for dysphoric mood.      Physical Exam:  BP (!) 157/84   Pulse 87   Temp 98.3 F (36.8 C) (Oral)   Ht 5\' 6"  (1.676 m)   Wt (!) 302 lb 9.6 oz (137.3 kg)   BMI 48.84 kg/m  CONSTITUTIONAL: Well-developed, well-nourished obese female in no acute distress.  HENT:  Normocephalic, atraumatic, External right and left ear normal. Oropharynx is clear and moist EYES: Conjunctivae normal. No scleral icterus.  NECK: Normal range of motion, supple, no masses.  Normal thyroid.  SKIN: Skin is warm and dry. No rash noted. Not diaphoretic. No erythema. No pallor. MUSCULOSKELETAL: Normal range of motion. No tenderness.  No cyanosis, clubbing, or edema.   NEUROLOGIC: Alert and oriented to person, place, and time. Normal reflexes, muscle tone coordination.  PSYCHIATRIC: Normal mood and affect. Normal behavior. Normal judgment and thought content. CARDIOVASCULAR: Normal heart rate noted, regular rhythm RESPIRATORY: Clear to auscultation bilaterally. Effort and breath sounds normal, no problems with respiration noted. BREASTS: Declined ABDOMEN: Soft, normal bowel sounds, no distention noted.  No tenderness, rebound or guarding.  PELVIC: Normal appearing external genitalia; normal appearing vaginal mucosa and cervix.  No abnormal discharge noted.  Pap smear obtained.  Normal uterine  size, no other palpable masses, no uterine or adnexal tenderness. Rectocele noted. IUD strings grasped with ring forceps and removed intact without difficulty and shown to patient. Pt tolerated well.    Assessment and Plan:     1. Preventative health care - SBE, Applauded her weight loss efforts and self care.   2. Cervical cancer screening  - Cytology - PAP  3. IUD removal  Will follow up results of pap smear and manage accordingly. Mammogram will be scheduled by the [facility] that it is uncertain whether her periods will return if if she could have gone through menopause and will not get tham any more. Advised contraception until amenorrhea has lasted for at least 12  months off of hormones.  Routine preventative health maintenance measures emphasized. Please refer to After Visit Summary for other counseling recommendations.      Manya Silvas, [place] for [employer], [place]

## 2019-04-26 LAB — CYTOLOGY - PAP
Diagnosis: NEGATIVE
HPV: NOT DETECTED

## 2019-04-26 MED ORDER — FLUCONAZOLE 150 MG PO TABS: 150.0000 mg | ORAL_TABLET | Freq: Once | ORAL | 2 refills | Status: AC

## 2019-04-26 NOTE — Addendum Note (Signed)
Addended by: Manya Silvas on: 04/26/2019 02:41 PM   Modules accepted: Orders

## 2019-05-08 ENCOUNTER — Encounter: Payer: Self-pay | Admitting: *Deleted

## 2019-06-13 ENCOUNTER — Ambulatory Visit: Payer: Medicaid Other | Admitting: Podiatry

## 2019-06-18 ENCOUNTER — Ambulatory Visit: Payer: Medicaid Other | Admitting: Podiatry

## 2019-11-20 ENCOUNTER — Encounter: Payer: Self-pay | Admitting: Podiatry

## 2019-11-20 ENCOUNTER — Other Ambulatory Visit: Payer: Self-pay

## 2019-11-20 ENCOUNTER — Ambulatory Visit: Payer: Medicaid Other | Admitting: Podiatry

## 2019-11-20 DIAGNOSIS — M79675 Pain in left toe(s): Secondary | ICD-10-CM | POA: Diagnosis not present

## 2019-11-20 DIAGNOSIS — M79674 Pain in right toe(s): Secondary | ICD-10-CM | POA: Diagnosis not present

## 2019-11-20 DIAGNOSIS — E1142 Type 2 diabetes mellitus with diabetic polyneuropathy: Secondary | ICD-10-CM | POA: Diagnosis not present

## 2019-11-20 DIAGNOSIS — B351 Tinea unguium: Secondary | ICD-10-CM | POA: Diagnosis not present

## 2019-11-25 NOTE — Progress Notes (Signed)
Subjective: Kristin Reilly presents today for follow up of preventative diabetic foot care: and painful mycotic nails b/l that are difficult to trim. Pain interferes with ambulation. Aggravating factors include wearing enclosed shoe gear. Pain is relieved with periodic professional debridement.   Allergies  Allergen Reactions  . Fenofibrate Nausea And Vomiting  . Tylox [Oxycodone-Acetaminophen] Other (See Comments)    Migraine HA and GI upset     Objective: There were no vitals filed for this visit.  Vascular Examination:  capillary refill time to digits immediate b/l, palpable DP pulses b/l, palpable PT pulses b/l, pedal hair sparse b/l and skin temperature gradient within normal limits b/l  Dermatological Examination: Pedal skin with normal turgor, texture and tone bilaterally, no open wounds bilaterally, no interdigital macerations bilaterally. and toenails 1-5 b/l elongated, dystrophic, thickened, crumbly with subungual debris  Musculoskeletal: normal muscle strength 5/5 to all lower extremity muscle groups bilaterally, no gross bony deformities bilaterally and no pain crepitus or joint limitation noted with ROM  Neurological: sensation intact 5/5 intact bilaterally with 10g monofilament and vibratory sensation absent  Assessment: 1. Pain due to onychomycosis of toenails of both feet   2. Diabetic peripheral neuropathy associated with type 2 diabetes mellitus ([place])      Plan: Continue diabetic foot care principles. Literature dispensed on today.  -Toenails 1-5 b/l were debrided in length and girth without iatrogenic bleeding. -Patient to continue soft, supportive shoe gear daily. -Patient to report any pedal injuries to medical professional immediately. -Patient/POA to call should there be question/concern in the interim.  Return in about 3 months (around 02/18/2020).

## 2019-12-13 ENCOUNTER — Emergency Department (HOSPITAL_COMMUNITY): Payer: Medicaid Other

## 2019-12-13 ENCOUNTER — Other Ambulatory Visit: Payer: Self-pay

## 2019-12-13 ENCOUNTER — Inpatient Hospital Stay (HOSPITAL_COMMUNITY)
Admission: EM | Admit: 2019-12-13 | Discharge: 2019-12-16 | DRG: 247 | Disposition: A | Discharge status: Home / Self Care | Payer: Medicaid Other | Attending: Internal Medicine | Admitting: Internal Medicine

## 2019-12-13 DIAGNOSIS — Z794 Long term (current) use of insulin: Secondary | ICD-10-CM

## 2019-12-13 DIAGNOSIS — Z823 Family history of stroke: Secondary | ICD-10-CM

## 2019-12-13 DIAGNOSIS — G4730 Sleep apnea, unspecified: Secondary | ICD-10-CM | POA: Diagnosis present

## 2019-12-13 DIAGNOSIS — Z20822 Contact with and (suspected) exposure to covid-19: Secondary | ICD-10-CM | POA: Diagnosis present

## 2019-12-13 DIAGNOSIS — I2119 ST elevation (STEMI) myocardial infarction involving other coronary artery of inferior wall: Secondary | ICD-10-CM | POA: Diagnosis present

## 2019-12-13 DIAGNOSIS — Z818 Family history of other mental and behavioral disorders: Secondary | ICD-10-CM

## 2019-12-13 DIAGNOSIS — Z811 Family history of alcohol abuse and dependence: Secondary | ICD-10-CM | POA: Diagnosis not present

## 2019-12-13 DIAGNOSIS — Z7984 Long term (current) use of oral hypoglycemic drugs: Secondary | ICD-10-CM

## 2019-12-13 DIAGNOSIS — E1165 Type 2 diabetes mellitus with hyperglycemia: Secondary | ICD-10-CM | POA: Diagnosis present

## 2019-12-13 DIAGNOSIS — I2111 ST elevation (STEMI) myocardial infarction involving right coronary artery: Secondary | ICD-10-CM | POA: Diagnosis not present

## 2019-12-13 DIAGNOSIS — Z832 Family history of diseases of the blood and blood-forming organs and certain disorders involving the immune mechanism: Secondary | ICD-10-CM | POA: Diagnosis not present

## 2019-12-13 DIAGNOSIS — Z808 Family history of malignant neoplasm of other organs or systems: Secondary | ICD-10-CM

## 2019-12-13 DIAGNOSIS — F329 Major depressive disorder, single episode, unspecified: Secondary | ICD-10-CM | POA: Diagnosis present

## 2019-12-13 DIAGNOSIS — Z87891 Personal history of nicotine dependence: Secondary | ICD-10-CM

## 2019-12-13 DIAGNOSIS — I214 Non-ST elevation (NSTEMI) myocardial infarction: Secondary | ICD-10-CM | POA: Diagnosis present

## 2019-12-13 DIAGNOSIS — E119 Type 2 diabetes mellitus without complications: Secondary | ICD-10-CM | POA: Diagnosis not present

## 2019-12-13 DIAGNOSIS — I10 Essential (primary) hypertension: Secondary | ICD-10-CM | POA: Diagnosis present

## 2019-12-13 DIAGNOSIS — Z888 Allergy status to other drugs, medicaments and biological substances status: Secondary | ICD-10-CM

## 2019-12-13 DIAGNOSIS — E785 Hyperlipidemia, unspecified: Secondary | ICD-10-CM | POA: Diagnosis present

## 2019-12-13 DIAGNOSIS — H409 Unspecified glaucoma: Secondary | ICD-10-CM | POA: Diagnosis present

## 2019-12-13 DIAGNOSIS — I2510 Atherosclerotic heart disease of native coronary artery without angina pectoris: Secondary | ICD-10-CM | POA: Diagnosis present

## 2019-12-13 DIAGNOSIS — Z6841 Body Mass Index (BMI) 40.0 and over, adult: Secondary | ICD-10-CM

## 2019-12-13 DIAGNOSIS — E782 Mixed hyperlipidemia: Secondary | ICD-10-CM | POA: Diagnosis not present

## 2019-12-13 DIAGNOSIS — Z955 Presence of coronary angioplasty implant and graft: Secondary | ICD-10-CM

## 2019-12-13 DIAGNOSIS — E1169 Type 2 diabetes mellitus with other specified complication: Secondary | ICD-10-CM

## 2019-12-13 LAB — LIPID PANEL
Cholesterol: 203 mg/dL — ABNORMAL HIGH (ref 0–200)
HDL: 46 mg/dL (ref >40)
LDL Cholesterol: 120 mg/dL — ABNORMAL HIGH (ref 0–99)
Total CHOL/HDL Ratio: 4.4 RATIO
Triglycerides: 186 mg/dL — ABNORMAL HIGH (ref <150)
VLDL: 37 mg/dL (ref 0–40)

## 2019-12-13 LAB — D-DIMER, QUANTITATIVE: D-Dimer, Quant: 0.42 ug/mL-FEU (ref 0.00–0.50)

## 2019-12-13 LAB — BASIC METABOLIC PANEL
Anion gap: 13 (ref 5–15)
BUN: 25 mg/dL — ABNORMAL HIGH (ref 6–20)
CO2: 26 mmol/L (ref 22–32)
Calcium: 9.2 mg/dL (ref 8.9–10.3)
Chloride: 98 mmol/L (ref 98–111)
Creatinine, Ser: 0.57 mg/dL (ref 0.44–1.00)
GFR calc Af Amer: >60 mL/min (ref >60)
GFR calc non Af Amer: >60 mL/min (ref >60)
Glucose, Bld: 240 mg/dL — ABNORMAL HIGH (ref 70–99)
Potassium: 3.9 mmol/L (ref 3.5–5.1)
Sodium: 137 mmol/L (ref 135–145)

## 2019-12-13 LAB — TROPONIN I (HIGH SENSITIVITY)
Troponin I (High Sensitivity): 273 ng/L (ref <18)
Troponin I (High Sensitivity): 297 ng/L (ref <18)
Troponin I (High Sensitivity): 513 ng/L (ref <18)

## 2019-12-13 LAB — CBC
HCT: 46.7 % — ABNORMAL HIGH (ref 36.0–46.0)
Hemoglobin: 15.2 g/dL — ABNORMAL HIGH (ref 12.0–15.0)
MCH: 27.2 pg (ref 26.0–34.0)
MCHC: 32.5 g/dL (ref 30.0–36.0)
MCV: 83.5 fL (ref 80.0–100.0)
Platelets: 256 10*3/uL (ref 150–400)
RBC: 5.59 MIL/uL — ABNORMAL HIGH (ref 3.87–5.11)
RDW: 13.4 % (ref 11.5–15.5)
WBC: 9.2 10*3/uL (ref 4.0–10.5)
nRBC: 0 % (ref 0.0–0.2)

## 2019-12-13 LAB — HEPATIC FUNCTION PANEL
ALT: 35 U/L (ref 0–44)
AST: 31 U/L (ref 15–41)
Albumin: 4 g/dL (ref 3.5–5.0)
Alkaline Phosphatase: 132 U/L — ABNORMAL HIGH (ref 38–126)
Bilirubin, Direct: 0.2 mg/dL (ref 0.0–0.2)
Indirect Bilirubin: 0.5 mg/dL (ref 0.3–0.9)
Total Bilirubin: 0.7 mg/dL (ref 0.3–1.2)
Total Protein: 7.9 g/dL (ref 6.5–8.1)

## 2019-12-13 LAB — HEMOGLOBIN A1C
Hgb A1c MFr Bld: 7.5 % — ABNORMAL HIGH (ref 4.8–5.6)
Mean Plasma Glucose: 168.55 mg/dL

## 2019-12-13 LAB — RESPIRATORY PANEL BY RT PCR (FLU A&B, COVID)
Influenza A by PCR: NEGATIVE
Influenza B by PCR: NEGATIVE
SARS Coronavirus 2 by RT PCR: NEGATIVE

## 2019-12-13 LAB — CBG MONITORING, ED: Glucose-Capillary: 191 mg/dL — ABNORMAL HIGH (ref 70–99)

## 2019-12-13 LAB — I-STAT BETA HCG BLOOD, ED (MC, WL, AP ONLY): I-stat hCG, quantitative: <5 m[IU]/mL (ref <5)

## 2019-12-13 MED ORDER — IRBESARTAN 75 MG PO TABS
75.0000 mg | ORAL_TABLET | Freq: Every day | ORAL | Status: DC
  Administered 2019-12-14 – 2019-12-16 (×3): 75 mg via ORAL
  Filled 2019-12-13 (×3): qty 1

## 2019-12-13 MED ORDER — HYDROCHLOROTHIAZIDE 25 MG PO TABS
25.0000 mg | ORAL_TABLET | Freq: Every day | ORAL | Status: DC
  Administered 2019-12-13: 22:00:00 25 mg via ORAL
  Filled 2019-12-13: qty 1

## 2019-12-13 MED ORDER — ONDANSETRON HCL 4 MG/2ML IJ SOLN
4.0000 mg | Freq: Four times a day (QID) | INTRAMUSCULAR | Status: DC | PRN
  Administered 2019-12-14: 4 mg via INTRAVENOUS
  Filled 2019-12-13: qty 2

## 2019-12-13 MED ORDER — VITAMIN D 25 MCG (1000 UNIT) PO TABS
1000.0000 [IU] | ORAL_TABLET | Freq: Every day | ORAL | Status: DC
  Administered 2019-12-14 – 2019-12-16 (×3): 1000 [IU] via ORAL
  Filled 2019-12-13 (×3): qty 1

## 2019-12-13 MED ORDER — INSULIN ASPART 100 UNIT/ML ~~LOC~~ SOLN
0.0000 [IU] | Freq: Three times a day (TID) | SUBCUTANEOUS | Status: DC
  Administered 2019-12-14: 13:00:00 3 [IU] via SUBCUTANEOUS
  Administered 2019-12-14: 17:00:00 2 [IU] via SUBCUTANEOUS
  Administered 2019-12-14: 5 [IU] via SUBCUTANEOUS
  Administered 2019-12-15: 17:00:00 3 [IU] via SUBCUTANEOUS
  Administered 2019-12-15 – 2019-12-16 (×3): 2 [IU] via SUBCUTANEOUS
  Filled 2019-12-13: qty 0.09

## 2019-12-13 MED ORDER — ASCORBIC ACID 500 MG PO TABS
500.0000 mg | ORAL_TABLET | Freq: Every day | ORAL | Status: DC
  Administered 2019-12-14 – 2019-12-16 (×3): 500 mg via ORAL
  Filled 2019-12-13 (×3): qty 1

## 2019-12-13 MED ORDER — ONDANSETRON HCL 4 MG/2ML IJ SOLN
4.0000 mg | Freq: Once | INTRAMUSCULAR | Status: AC
  Administered 2019-12-13: 21:00:00 4 mg via INTRAVENOUS
  Filled 2019-12-13: qty 2

## 2019-12-13 MED ORDER — MORPHINE SULFATE (PF) 4 MG/ML IV SOLN
4.0000 mg | Freq: Once | INTRAVENOUS | Status: AC
  Administered 2019-12-13: 4 mg via INTRAVENOUS
  Filled 2019-12-13: qty 1

## 2019-12-13 MED ORDER — INSULIN ASPART 100 UNIT/ML ~~LOC~~ SOLN
0.0000 [IU] | Freq: Every day | SUBCUTANEOUS | Status: DC
  Filled 2019-12-13: qty 0.05

## 2019-12-13 MED ORDER — ACETAMINOPHEN 325 MG PO TABS: 650.0000 mg | ORAL_TABLET | ORAL | Status: DC | PRN

## 2019-12-13 MED ORDER — NITROGLYCERIN 0.4 MG SL SUBL
0.4000 mg | SUBLINGUAL_TABLET | SUBLINGUAL | Status: DC | PRN
  Administered 2019-12-13 (×3): 0.4 mg via SUBLINGUAL
  Filled 2019-12-13 (×3): qty 1

## 2019-12-13 MED ORDER — PRAVASTATIN SODIUM 10 MG PO TABS: 20.0000 mg | ORAL_TABLET | Freq: Every day | ORAL | Status: DC

## 2019-12-13 MED ORDER — AMITRIPTYLINE HCL 25 MG PO TABS
25.0000 mg | ORAL_TABLET | Freq: Every evening | ORAL | Status: DC | PRN
  Filled 2019-12-13: qty 1

## 2019-12-13 MED ORDER — HEPARIN BOLUS VIA INFUSION
4000.0000 [IU] | Freq: Once | INTRAVENOUS | Status: AC
  Administered 2019-12-13: 4000 [IU] via INTRAVENOUS
  Filled 2019-12-13: qty 4000

## 2019-12-13 MED ORDER — ASPIRIN 81 MG PO CHEW
324.0000 mg | CHEWABLE_TABLET | Freq: Once | ORAL | Status: AC
  Administered 2019-12-13: 17:00:00 324 mg via ORAL
  Filled 2019-12-13: qty 4

## 2019-12-13 MED ORDER — SODIUM CHLORIDE 0.9% FLUSH: 3.0000 mL | Freq: Once | INTRAVENOUS | Status: DC

## 2019-12-13 MED ORDER — CELECOXIB 200 MG PO CAPS
200.0000 mg | ORAL_CAPSULE | Freq: Every day | ORAL | Status: DC
  Administered 2019-12-14 – 2019-12-16 (×3): 200 mg via ORAL
  Filled 2019-12-13 (×3): qty 1

## 2019-12-13 MED ORDER — HEPARIN (PORCINE) 25000 UT/250ML-% IV SOLN
1350.0000 [IU]/h | INTRAVENOUS | Status: DC
  Administered 2019-12-13: 1350 [IU]/h via INTRAVENOUS
  Filled 2019-12-13: qty 250

## 2019-12-13 MED ORDER — MORPHINE SULFATE (PF) 2 MG/ML IV SOLN
1.0000 mg | INTRAVENOUS | Status: DC | PRN
  Administered 2019-12-14: 1 mg via INTRAVENOUS
  Filled 2019-12-13: qty 1

## 2019-12-13 MED ORDER — HYDRALAZINE HCL 20 MG/ML IJ SOLN: 5.0000 mg | INTRAMUSCULAR | Status: DC | PRN

## 2019-12-13 NOTE — Progress Notes (Signed)
Patient arrived on stretcher accompanied by EMS. Patient alert and oriented X4. Reporting 4/10 chest pain. Non-radiating. Heparin drip infusing at 1350 units per hour. VS-98.3, 66, 22, 171/84, 96% RA. Ambulating independently. Cardiology provider and admission team aware of arrival as well as last troponin level of 513. EKG performed as well as skin check with charge RN

## 2019-12-13 NOTE — Progress Notes (Signed)
[place] for IV heparin Indication: NSTEMI  Allergies  Allergen Reactions  . Fenofibrate Nausea And Vomiting  . Tylox [Oxycodone-Acetaminophen] Other (See Comments)    Migraine HA and GI upset    Patient Measurements: Height: 5\' 6"  (167.6 cm) Weight: (!) 330 lb (149.7 kg) IBW/kg (Calculated) : 59.3 Heparin Dosing Weight: 97 kg  Vital Signs: Temp: 97.7 F (36.5 C) (02/11 1555) Temp Source: Oral (02/11 1555) BP: 163/106 (02/11 1942) Pulse Rate: 97 (02/11 1942)  Labs: Recent Labs    12/13/19 1638 12/13/19 1903  HGB 15.2*  --   HCT 46.7*  --   PLT 256  --   CREATININE 0.57  --   TROPONINIHS 273* 297*    Estimated Creatinine Clearance: 119.8 mL/min (by C-G formula based on SCr of 0.57 mg/dL).   Medical History: Past Medical History:  Diagnosis Date  . Arthritis   . Depression   . Glaucoma   . Hyperlipidemia   . Hypertension   . Sleep apnea    no cpap    Medications:  (Not in a hospital admission)  Scheduled:  . heparin  4,000 Units Intravenous Once  . ondansetron (ZOFRAN) IV  4 mg Intravenous Once  . sodium chloride flush  3 mL Intravenous Once   Infusions:  . heparin      Assessment: 16 yoF with PMH HTN, HLD, sleep apnea, former smoker, presenting with chest pain; suspected to have NSTEMI. Pharmacy to dose IV heparin.   Baseline INR, aPTT: not done  Prior anticoagulation: none  Significant events:  Today, 12/13/2019:  CBC: hemoconcentrated on admission, Plt WNL  No bleeding or infusion issues per nursing  Goal of Therapy: Heparin level 0.3-0.7 units/ml Monitor platelets by anticoagulation protocol: Yes  Plan:  Heparin 4000 units IV bolus x 1  Heparin 1350 units/hr IV infusion  Check heparin level 8 hrs after start  Daily CBC, daily heparin level once stable  Monitor for signs of bleeding or thrombosis  Reuel Boom, PharmD, BCPS [phone] 12/13/2019, 8:25 PM

## 2019-12-13 NOTE — ED Notes (Signed)
Hospitalist at pt bedside.

## 2019-12-13 NOTE — ED Notes (Signed)
Patient being picked up by [facility] to be taken to [facility].

## 2019-12-13 NOTE — ED Notes (Signed)
Date and time results received: 12/13/19 2005 (use smartphrase ".now" to insert current time)  Test: Troponin Critical Value: 297  Name of Provider Notified: Lacinda Axon, MD  Orders Received? Or Actions Taken?: Actions Taken: MD notified

## 2019-12-13 NOTE — ED Provider Notes (Signed)
[facility] DEPT Provider Note   CSN: [id number] Arrival date & time: 12/13/19  1543     History Chief Complaint  Patient presents with   Chest Pain    Kristin Reilly is a 54 y.o. female with PMHx HTN, HLD, depression, sleep apnea no CPAP, who presents to the ED today complaining of sudden onset, constant, sharp, substernal chest pressure that began around 1 AM this morning.  Patient reports she woke up out of her sleep with the pain radiation into her jaw.  She does endorse that she ate pizza last night and thought maybe she was having acid reflux however she has not been diagnosed with GERD and does not take anything for acid reflux daily.  She states that she sat up for about an hour and the pain wanting away.  She woke up this morning and felt improved however after having coffee she felt immediate pain again and states it has been present since then.  States she has also felt nauseated and has vomited several times.  She does endorse that she "typically vomits due to pain."  Patient also mentions that she ate some notches from [employer] for lunch which did not help her pain whatsoever.  Patient is concerned due to the pain the nausea and the vomiting.  She denies any history personally of CAD or MIs.  Patient denies family history of Mis.  Patient is a former smoker, states that she smoked 1 pack/day for about 16 years and quit in 2008.  Patient denies any history of DVT/PE.  No recent prolonged travel or immobilization.  No exogenous hormone use.  No hemoptysis.  No active malignancy.  Patient denies fever, chills, cough, abdominal pain, diarrhea, leg swelling, diaphoresis, any other associated symptoms.  The history is provided by the patient and medical records. No language interpreter was used.       Past Medical History:  Diagnosis Date   Arthritis    Depression    Glaucoma    Hyperlipidemia    Hypertension    Sleep apnea    no cpap     Patient Active Problem List   Diagnosis Date Noted   NSTEMI (non-ST elevated myocardial infarction) (Post) 12/13/2019   HLD (hyperlipidemia) 12/13/2019   History of colonic polyps    Benign neoplasm of cecum    Type 2 diabetes mellitus without complication, without long-term current use of insulin ([place]) 09/22/2018   Shoulder pain, left 12/24/2015   Risk for falls 12/02/2015   Screening for colon cancer    Benign neoplasm of transverse colon    Benign neoplasm of rectum    Chronic respiratory infection 12/23/2014   Cutaneous eruption 08/16/2014   Bursitis, trochanteric 04/24/2014   Arthralgia of hip 03/18/2014   Essential (primary) hypertension 12/07/2013   Corn or callus 11/19/2013   Personal history of tobacco use, presenting hazards to health 11/19/2013   Abnormal blood sugar 06/25/2013   Back ache 06/25/2013   Family history of cerebrovascular accident 06/25/2013   Hypertriglyceridemia 06/25/2013   Extreme obesity 06/25/2013   Abnormal findings-skull or head 06/25/2013   Obstructive apnea 06/25/2013   Extremity pain 06/25/2013   Avitaminosis D 06/25/2013   Nonspecific abnormal findings on radiological and examination of skull and head 06/25/2013    Past Surgical History:  Procedure Laterality Date   APPENDECTOMY     COLONOSCOPY N/A 08/05/2015   Procedure: COLONOSCOPY;  Surgeon: Jerene Bears, MD;  Location: [facility];  Service: Gastroenterology;  Laterality: N/A;   COLONOSCOPY WITH PROPOFOL N/A 01/09/2019   Procedure: COLONOSCOPY WITH PROPOFOL;  Surgeon: Jerene Bears, MD;  Location: [facility];  Service: Gastroenterology;  Laterality: N/A;   POLYPECTOMY  01/09/2019   Procedure: POLYPECTOMY;  Surgeon: Jerene Bears, MD;  Location: [facility];  Service: Gastroenterology;;   TONSILLECTOMY     WISDOM TOOTH EXTRACTION       OB History    Gravida  3   Para  1   Term  1   Preterm  0   AB  2   Living  1     SAB  1   TAB   1   Ectopic  0   Multiple  0   Live Births  1           Family History  Problem Relation Age of Onset   CVA Mother    Cerebral aneurysm Mother    Clotting disorder Mother    Aneurysm Maternal Grandmother    Mental illness Sister    Alcoholism Brother    Throat cancer Maternal Grandfather    Bipolar disorder Sister    ADD / ADHD Son    Colon cancer Neg Hx    Breast cancer Neg Hx    Esophageal cancer Neg Hx    Rectal cancer Neg Hx    Stomach cancer Neg Hx     Social History   Tobacco Use   Smoking status: Former Smoker    Quit date: 07/21/2005    Years since quitting: 14.4   Smokeless tobacco: Never Used   Tobacco comment: quit 2008  Substance Use Topics   Alcohol use: Yes    Alcohol/week: 0.0 standard drinks    Comment: occasionally   Drug use: No    Home Medications Prior to Admission medications   Medication Sig Start Date End Date Taking? Authorizing Provider  amitriptyline (ELAVIL) 25 MG tablet Take 25 mg by mouth at bedtime as needed for sleep.  09/05/18  Yes [provider]  celecoxib (CELEBREX) 200 MG capsule Take 200 mg by mouth daily.  05/24/17  Yes [provider]  cholecalciferol (VITAMIN D3) 25 MCG (1000 UT) tablet Take 1,000 Units by mouth daily.   Yes [provider]  hydrochlorothiazide (HYDRODIURIL) 25 MG tablet Take 25 mg by mouth daily.    Yes [provider]  metFORMIN (GLUCOPHAGE-XR) 500 MG 24 hr tablet Take 500 mg by mouth 2 (two) times daily with a meal.  09/22/18  Yes [provider]  NON FORMULARY Place 1 drop into both eyes daily.   Yes [provider]  nystatin (MYCOSTATIN/NYSTOP) powder Apply 1 Bottle topically daily as needed (irritation).  06/05/18  Yes [provider]  pravastatin (PRAVACHOL) 10 MG tablet Take 20 mg by mouth daily.  09/06/18  Yes [provider]  valsartan (DIOVAN) 80 MG tablet Take 80 mg by mouth daily.  09/06/18  Yes [provider]  vitamin C (ASCORBIC ACID) 500 MG tablet Take 500 mg by mouth daily.   Yes [provider]  ACCU-CHEK SOFTCLIX LANCETS lancets 1 each by Misc.(Non-Drug; Combo Route) route daily. 09/06/18   [provider]  Blood Glucose Monitoring Suppl (GLUCOCOM BLOOD GLUCOSE MONITOR) DEVI 1 each by Misc.(Non-Drug; Combo Route) route daily. Include strips. Lancets, lancet device, control solution. batteries 09/06/18   [provider]  glucose blood test strip 1 each by Misc.(Non-Drug; Combo Route) route daily. 09/06/18   [provider]  Allergies    Fenofibrate and Tylox [oxycodone-acetaminophen]  Review of Systems   Review of Systems  Constitutional: Negative for chills and fever.  Respiratory: Positive for shortness of breath. Negative for cough.   Cardiovascular: Positive for chest pain. Negative for palpitations and leg swelling.  Gastrointestinal: Positive for nausea and vomiting. Negative for abdominal pain, constipation and diarrhea.  Musculoskeletal: Negative for back pain.  All other systems reviewed and are negative.   Physical Exam Updated Vital Signs BP (!) 156/95 (BP Location: Right Arm)    Pulse 75    Temp 97.7 F (36.5 C) (Oral)    Resp 17    Ht '5\' 6"'  (1.676 m)    Wt (!) 149.7 kg    LMP  (Approximate)    SpO2 97%    BMI 53.26 kg/m   Physical Exam Vitals and nursing note reviewed.  Constitutional:      Appearance: She is obese. She is not ill-appearing or diaphoretic.  HENT:     Head: Normocephalic and atraumatic.  Eyes:     Conjunctiva/sclera: Conjunctivae normal.  Cardiovascular:     Rate and Rhythm: Normal rate and regular rhythm.     Pulses:          Radial pulses are 2+ on the right side and 2+ on the left side.       Dorsalis pedis pulses are 2+ on the right side and 2+ on the left side.     Heart sounds: Normal heart sounds.  Pulmonary:     Effort: Pulmonary effort is normal.     Breath sounds: Normal breath sounds. No  decreased breath sounds, wheezing, rhonchi or rales.  Chest:     Chest wall: Tenderness (substernal) present.  Abdominal:     Palpations: Abdomen is soft.     Tenderness: There is no abdominal tenderness. There is no guarding or rebound.  Musculoskeletal:     Cervical back: Neck supple.     Comments: Nonpitting edema bilaterally  Skin:    General: Skin is warm and dry.  Neurological:     Mental Status: She is alert.     ED Results / Procedures / Treatments   Labs (all labs ordered are listed, but only abnormal results are displayed) Labs Reviewed  BASIC METABOLIC PANEL - Abnormal; Notable for the following components:      Result Value   Glucose, Bld 240 (*)    BUN 25 (*)    All other components within normal limits  CBC - Abnormal; Notable for the following components:   RBC 5.59 (*)    Hemoglobin 15.2 (*)    HCT 46.7 (*)    All other components within normal limits  HEPATIC FUNCTION PANEL - Abnormal; Notable for the following components:   Alkaline Phosphatase 132 (*)    All other components within normal limits  TROPONIN I (HIGH SENSITIVITY) - Abnormal; Notable for the following components:   Troponin I (High Sensitivity) 273 (*)    All other components within normal limits  TROPONIN I (HIGH SENSITIVITY) - Abnormal; Notable for the following components:   Troponin I (High Sensitivity) 297 (*)    All other components within normal limits  RESPIRATORY PANEL BY RT PCR (FLU A&B, COVID)  D-DIMER, QUANTITATIVE (NOT AT [facility])  CBC  HEPARIN LEVEL (UNFRACTIONATED)  LIPID PANEL  HEMOGLOBIN A1C  HIV ANTIBODY (ROUTINE TESTING W REFLEX)  I-STAT BETA HCG BLOOD, ED ([facility], [facility], [facility] ONLY)  TROPONIN I (HIGH SENSITIVITY)    EKG  EKG Interpretation  Date/Time:  Thursday December 13 2019 16:34:05 EST Ventricular Rate:  76 PR Interval:    QRS Duration: 110 QT Interval:  406 QTC Calculation: 457 R Axis:   82 Text Interpretation: Sinus rhythm Confirmed by Nat Christen [phone]) on  12/13/2019 4:36:21 PM   Radiology DG Chest 2 View  Result Date: 12/13/2019 CLINICAL DATA:  Chest pain. EXAM: CHEST - 2 VIEW COMPARISON:  December 30, 2014. FINDINGS: The heart size and mediastinal contours are within normal limits. Both lungs are clear. No pneumothorax or pleural effusion is noted. The visualized skeletal structures are unremarkable. IMPRESSION: No active cardiopulmonary disease. Electronically Signed   By: Marijo Conception M.D.   On: 12/13/2019 16:59    Procedures .Critical Care Performed by: Eustaquio Maize, PA-C Authorized by: Eustaquio Maize, PA-C   Critical care provider statement:    Critical care time (minutes):  45   Critical care was necessary to treat or prevent imminent or life-threatening deterioration of the following conditions:  Cardiac failure   Critical care was time spent personally by me on the following activities:  Discussions with consultants, evaluation of patient's response to treatment, examination of patient, ordering and performing treatments and interventions, ordering and review of laboratory studies, ordering and review of radiographic studies, pulse oximetry, re-evaluation of patient's condition, obtaining history from patient or surrogate and review of old charts   (including critical care time)  Medications Ordered in ED Medications  sodium chloride flush (NS) 0.9 % injection 3 mL ( Intravenous Canceled Entry 12/13/19 2001)  nitroGLYCERIN (NITROSTAT) SL tablet 0.4 mg (0.4 mg Sublingual Given 12/13/19 2002)  heparin ADULT infusion 100 units/mL (25000 units/246m sodium chloride 0.45%) (1,350 Units/hr Intravenous New Bag/Given 12/13/19 2038)  morphine 2 MG/ML injection 1 mg (has no administration in time range)  ondansetron (ZOFRAN) injection 4 mg (has no administration in time range)  celecoxib (CELEBREX) capsule 200 mg (has no administration in time range)  pravastatin (PRAVACHOL) tablet 20 mg (has no administration in time range)    amitriptyline (ELAVIL) tablet 25 mg (has no administration in time range)  cholecalciferol (VITAMIN D3) tablet 1,000 Units (has no administration in time range)  ascorbic acid (VITAMIN C) tablet 500 mg (has no administration in time range)  acetaminophen (TYLENOL) tablet 650 mg (has no administration in time range)  insulin aspart (novoLOG) injection 0-9 Units (has no administration in time range)  insulin aspart (novoLOG) injection 0-5 Units (has no administration in time range)  hydrochlorothiazide (HYDRODIURIL) tablet 25 mg (has no administration in time range)  irbesartan (AVAPRO) tablet 75 mg (has no administration in time range)  hydrALAZINE (APRESOLINE) injection 5 mg (has no administration in time range)  aspirin chewable tablet 324 mg (324 mg Oral Given 12/13/19 1650)  morphine 4 MG/ML injection 4 mg (4 mg Intravenous Given 12/13/19 1947)  ondansetron (ZOFRAN) injection 4 mg (4 mg Intravenous Given 12/13/19 2034)  heparin bolus via infusion 4,000 Units (4,000 Units Intravenous Bolus from Bag 12/13/19 2038)    ED Course  I have reviewed the triage vital signs and the nursing notes.  Pertinent labs & imaging results that were available during my care of the patient were reviewed by me and considered in my medical decision making (see chart for details).  54year old female who presents the ED today complaining of substernal chest pain that woke her up out of sleep around 1 AM with nausea, vomiting, shortness of breath.  No history of CAD however does have risk factors including obesity,  hypertension, diabetes.  On arrival to the ED patient is afebrile, nontachycardic and nontachypneic.  Area is somewhat concerning for ACS however does report that she ate pizza last night and first thought that it was GERD.  Her chest pain went away but returned this morning after eating coffee, she does report she also had nausea as earlier today which worsen the pain.  Question if this is GERD versus ACS.   She does not have any risk factors for PE however in age I cannot PERC her out, will obtain D-dimer.  Has equal pulses and is denying any ripping chest pain or back pain.  I doubt dissection today.   EKG on arrival did not appear concerning however repeat EKG was done with questionable ST elevation, repeat 1 afterwards was more reassuring.  Attending physician Dr. Lacinda Axon evaluated EKG and does not believe that it is a true STEMI.  Given aspirin, nitroglycerin, morphine for pain.   CXR clear.  CBC without leukocytosis. Hgb stable.  BMP with glucose elevation 240. LFTS with stable alk phos compared to baseline.   Initial troponin 273. Will consult cardiology at this time.   Discussed case with Dr. Gardiner Rhyme; awaiting repeat trop however will treat as NSTEMI today. Plan for admission to [facility]. Will start heparin.   Repeat trop 297, Dr. Gardiner Rhyme called back and reports due to not a very significant delta trop felt pt was stable for admission to [facility] and cardiology will follow in the morning. Will consult hosptalist.   Discussed case with Dr. Marlowe Sax who agrees to accept patient.     MDM Rules/Calculators/A&P                       Final Clinical Impression(s) / ED Diagnoses Final diagnoses:  NSTEMI (non-ST elevated myocardial infarction) ([place])  Type 2 diabetes mellitus with hyperglycemia, with long-term current use of insulin [facility])  Essential hypertension    Rx / DC Orders ED Discharge Orders    None       Eustaquio Maize, PA-C 12/13/19 2102    Nat Christen, MD 12/17/19 [phone]

## 2019-12-13 NOTE — ED Notes (Signed)
Report called to 6E for pt transfer to the floor. [facility] at pt bedside

## 2019-12-13 NOTE — H&P (Signed)
History and Physical    Kristin Reilly [id number] DOB: September 30, 1966 DOA: 12/13/2019  PCP: Berkley Harvey, NP Patient coming from: Home  Chief Complaint: Chest pain  HPI: Kristin Reilly is a 54 y.o. female with medical history significant of morbid obesity (BMI 41), hypertension, hyperlipidemia, non-insulin-dependent diabetes, sleep apnea not on CPAP, depression presenting to the ED for evaluation of chest pain.  Patient states last night she woke up from her sleep around 2-3 in the morning with substernal chest pain which was pressure-like/aching and nonradiating.  She woke up and had some water thinking this was indigestion but the pain lasted about 3 hours.  She then went back to sleep and after waking up in the morning, again experienced substernal chest pressure.  She then walked to a store and and had breakfast.  Soon after eating breakfast, she vomited and became diaphoretic.  Her chest pain lasted the entire day and finally improved after she received 3 doses of sublingual nitroglycerin and morphine in the ED.  Denies prior history of exertional angina.  Denies history of CAD.  ED Course: Blood pressure elevated with systolic in the 123456 on arrival, remainder of vital signs stable.  D-dimer normal.  High-sensitivity troponin 273 >297.  EKG without acute ischemic changes.  SARS-CoV-2 PCR test pending. Chest x-ray showing no active cardiopulmonary disease.  Patient received aspirin 324 mg, morphine, Zofran, sublingual nitroglycerin, and was started on heparin infusion.  Review of Systems:  All systems reviewed and apart from history of presenting illness, are negative.  Past Medical History:  Diagnosis Date  . Arthritis   . Depression   . Glaucoma   . Hyperlipidemia   . Hypertension   . Sleep apnea    no cpap    Past Surgical History:  Procedure Laterality Date  . APPENDECTOMY    . COLONOSCOPY N/A 08/05/2015   Procedure: COLONOSCOPY;  Surgeon: Jerene Bears, MD;   Location: [facility];  Service: Gastroenterology;  Laterality: N/A;  . COLONOSCOPY WITH PROPOFOL N/A 01/09/2019   Procedure: COLONOSCOPY WITH PROPOFOL;  Surgeon: Jerene Bears, MD;  Location: [facility];  Service: Gastroenterology;  Laterality: N/A;  . POLYPECTOMY  01/09/2019   Procedure: POLYPECTOMY;  Surgeon: Jerene Bears, MD;  Location: [facility];  Service: Gastroenterology;;  . TONSILLECTOMY    . WISDOM TOOTH EXTRACTION       reports that she quit smoking about 14 years ago. She has never used smokeless tobacco. She reports current alcohol use. She reports that she does not use drugs.  Allergies  Allergen Reactions  . Fenofibrate Nausea And Vomiting  . Tylox [Oxycodone-Acetaminophen] Other (See Comments)    Migraine HA and GI upset    Family History  Problem Relation Age of Onset  . CVA Mother   . Cerebral aneurysm Mother   . Clotting disorder Mother   . Aneurysm Maternal Grandmother   . Mental illness Sister   . Alcoholism Brother   . Throat cancer Maternal Grandfather   . Bipolar disorder Sister   . ADD / ADHD Son   . Colon cancer Neg Hx   . Breast cancer Neg Hx   . Esophageal cancer Neg Hx   . Rectal cancer Neg Hx   . Stomach cancer Neg Hx     Prior to Admission medications   Medication Sig Start Date End Date Taking? Authorizing Provider  amitriptyline (ELAVIL) 25 MG tablet Take 25 mg by mouth at bedtime as needed for sleep.  09/05/18  Yes [provider]  celecoxib (CELEBREX) 200 MG capsule Take 200 mg by mouth daily.  05/24/17  Yes [provider]  cholecalciferol (VITAMIN D3) 25 MCG (1000 UT) tablet Take 1,000 Units by mouth daily.   Yes [provider]  hydrochlorothiazide (HYDRODIURIL) 25 MG tablet Take 25 mg by mouth daily.    Yes [provider]  metFORMIN (GLUCOPHAGE-XR) 500 MG 24 hr tablet Take 500 mg by mouth 2 (two) times daily with a meal.  09/22/18  Yes [provider]  NON FORMULARY Place 1 drop into  both eyes daily.   Yes [provider]  nystatin (MYCOSTATIN/NYSTOP) powder Apply 1 Bottle topically daily as needed (irritation).  06/05/18  Yes [provider]  pravastatin (PRAVACHOL) 10 MG tablet Take 20 mg by mouth daily.  09/06/18  Yes [provider]  valsartan (DIOVAN) 80 MG tablet Take 80 mg by mouth daily.  09/06/18  Yes [provider]  vitamin C (ASCORBIC ACID) 500 MG tablet Take 500 mg by mouth daily.   Yes [provider]  ACCU-CHEK SOFTCLIX LANCETS lancets 1 each by Misc.(Non-Drug; Combo Route) route daily. 09/06/18   [provider]  Blood Glucose Monitoring Suppl (GLUCOCOM BLOOD GLUCOSE MONITOR) DEVI 1 each by Misc.(Non-Drug; Combo Route) route daily. Include strips. Lancets, lancet device, control solution. batteries 09/06/18   [provider]  glucose blood test strip 1 each by Misc.(Non-Drug; Combo Route) route daily. 09/06/18   [provider]    Physical Exam: Vitals:   12/13/19 1555 12/13/19 1614 12/13/19 1942  BP: (!) 165/118 (!) 156/95 (!) 163/106  Pulse: 96 75 97  Resp: 18 17 18   Temp: 97.7 F (36.5 C)    TempSrc: Oral    SpO2: 98% 97% 96%  Weight:  (!) 149.7 kg   Height:  5\' 6"  (1.676 m)     Physical Exam  Constitutional: She is oriented to person, place, and time. She appears well-developed and well-nourished. No distress.  HENT:  Head: Normocephalic.  Eyes: Right eye exhibits no discharge. Left eye exhibits no discharge.  Cardiovascular: Normal rate, regular rhythm and intact distal pulses.  Pulmonary/Chest: Effort normal and breath sounds normal. No respiratory distress. She has no wheezes. She has no rales.  Abdominal: Soft. Bowel sounds are normal. She exhibits no distension. There is no abdominal tenderness. There is no guarding.  Musculoskeletal:        General: Edema present.     Cervical back: Neck supple.     Comments: +2 edema at the level of the ankles (chronic per patient)    Neurological: She is alert and oriented to person, place, and time.  Skin: Skin is warm and dry. She is not diaphoretic.     Labs on Admission: I have personally reviewed following labs and imaging studies  CBC: Recent Labs  Lab 12/13/19 1638  WBC 9.2  HGB 15.2*  HCT 46.7*  MCV 83.5  PLT 123456   Basic Metabolic Panel: Recent Labs  Lab 12/13/19 1638  NA 137  K 3.9  CL 98  CO2 26  GLUCOSE 240*  BUN 25*  CREATININE 0.57  CALCIUM 9.2   GFR: Estimated Creatinine Clearance: 119.8 mL/min (by C-G formula based on SCr of 0.57 mg/dL). Liver Function Tests: Recent Labs  Lab 12/13/19 1638  AST 31  ALT 35  ALKPHOS 132*  BILITOT 0.7  PROT 7.9  ALBUMIN 4.0   No results for input(s): LIPASE, AMYLASE in the last 168 hours. No  results for input(s): AMMONIA in the last 168 hours. Coagulation Profile: No results for input(s): INR, PROTIME in the last 168 hours. Cardiac Enzymes: No results for input(s): CKTOTAL, CKMB, CKMBINDEX, TROPONINI in the last 168 hours. BNP (last 3 results) No results for input(s): PROBNP in the last 8760 hours. HbA1C: No results for input(s): HGBA1C in the last 72 hours. CBG: No results for input(s): GLUCAP in the last 168 hours. Lipid Profile: No results for input(s): CHOL, HDL, LDLCALC, TRIG, CHOLHDL, LDLDIRECT in the last 72 hours. Thyroid Function Tests: No results for input(s): TSH, T4TOTAL, FREET4, T3FREE, THYROIDAB in the last 72 hours. Anemia Panel: No results for input(s): VITAMINB12, FOLATE, FERRITIN, TIBC, IRON, RETICCTPCT in the last 72 hours. Urine analysis: No results found for: COLORURINE, APPEARANCEUR, LABSPEC, [place], GLUCOSEU, HGBUR, BILIRUBINUR, KETONESUR, PROTEINUR, UROBILINOGEN, NITRITE, LEUKOCYTESUR  Radiological Exams on Admission: DG Chest 2 View  Result Date: 12/13/2019 CLINICAL DATA:  Chest pain. EXAM: CHEST - 2 VIEW COMPARISON:  December 30, 2014. FINDINGS: The heart size and mediastinal contours are within normal  limits. Both lungs are clear. No pneumothorax or pleural effusion is noted. The visualized skeletal structures are unremarkable. IMPRESSION: No active cardiopulmonary disease. Electronically Signed   By: Marijo Conception M.D.   On: 12/13/2019 16:59    EKG: Independently reviewed.  Sinus rhythm, heart rate 76.  No acute ischemic changes.  Assessment/Plan Principal Problem:   NSTEMI (non-ST elevated myocardial infarction) ([place]) Active Problems:   Essential (primary) hypertension   Type 2 diabetes mellitus without complication, without long-term current use of insulin (HCC)   HLD (hyperlipidemia)   NSTEMI No documented history of CAD but does have significant risk factors. High-sensitivity troponin 273 >297.  EKG without acute ischemic changes.  -Cardiac monitoring -Received full dose aspirin -Heparin bolus and infusion -Sublingual nitroglycerin as needed -Morphine as needed -Zofran as needed -Echocardiogram -Trend troponin -EKG as needed for recurrence of chest pain -Patient will be transferred to [facility] as she may need cardiac catheterization in the morning.  Spoke to Dr. Neena Rhymes, cardiology will consult.  Hypertension -Continue home hydrochlorothiazide, valsartan -Hydralazine as needed for SBP >170  Hyperlipidemia -Continue home pravastatin -Check lipid panel  Non-insulin-dependent diabetes -Check A1c.  Sliding scale insulin sensitive ACHS and CBG checks.  DVT prophylaxis: Heparin Code Status: Full code Family Communication: No family available at this time. Disposition Plan: Anticipate discharge after clinical improvement. Consults called: Cardiology Admission status: It is my clinical opinion that admission to INPATIENT is reasonable and necessary in this 54 y.o. female . presenting with NSTEMI.  Will likely need cardiac catheterization in the morning. Very high risk of decompensation.  Given the aforementioned, the predictability of an adverse outcome is felt to  be significant. I expect that the patient will require at least 2 midnights in the hospital to treat this condition.   The medical decision making on this patient was of high complexity and the patient is at high risk for clinical deterioration, therefore this is a level 3 visit.  Shela Leff MD [facility]  If 7PM-7AM, please contact night-coverage [email or web address] Password [email or web address]  12/13/2019, 9:00 PM

## 2019-12-13 NOTE — ED Triage Notes (Signed)
Pt presents with c/o chest pressure that started last night. Pt also has some shortness of breath. Pt reports that she burped before coming and vomited all over the bathroom.

## 2019-12-13 NOTE — ED Notes (Signed)
PT troponin 273 provider notified

## 2019-12-13 NOTE — ED Notes (Signed)
Pt ambulated to restroom. 

## 2019-12-13 NOTE — ED Notes (Signed)
Date and time results received: 12/13/19 1831 (use smartphrase ".now" to insert current time)  Test: Trop Critical Value: 273  Name of Provider Notified: Margaux  Orders Received? Or Actions Taken?: Orders Received - See Orders for details

## 2019-12-14 ENCOUNTER — Inpatient Hospital Stay (HOSPITAL_COMMUNITY): Payer: Medicaid Other

## 2019-12-14 ENCOUNTER — Encounter (HOSPITAL_COMMUNITY)
Admission: EM | Disposition: A | Discharge status: Home / Self Care | Payer: Self-pay | Source: Home / Self Care | Attending: Internal Medicine

## 2019-12-14 DIAGNOSIS — E119 Type 2 diabetes mellitus without complications: Secondary | ICD-10-CM

## 2019-12-14 DIAGNOSIS — I10 Essential (primary) hypertension: Secondary | ICD-10-CM

## 2019-12-14 DIAGNOSIS — I2111 ST elevation (STEMI) myocardial infarction involving right coronary artery: Secondary | ICD-10-CM

## 2019-12-14 DIAGNOSIS — E782 Mixed hyperlipidemia: Secondary | ICD-10-CM

## 2019-12-14 DIAGNOSIS — I2119 ST elevation (STEMI) myocardial infarction involving other coronary artery of inferior wall: Secondary | ICD-10-CM

## 2019-12-14 DIAGNOSIS — I2510 Atherosclerotic heart disease of native coronary artery without angina pectoris: Secondary | ICD-10-CM

## 2019-12-14 HISTORY — PX: CORONARY STENT INTERVENTION: CATH118234

## 2019-12-14 HISTORY — PX: LEFT HEART CATH AND CORONARY ANGIOGRAPHY: CATH118249

## 2019-12-14 HISTORY — PX: CORONARY/GRAFT ACUTE MI REVASCULARIZATION: CATH118305

## 2019-12-14 LAB — GLUCOSE, CAPILLARY
Glucose-Capillary: 194 mg/dL — ABNORMAL HIGH (ref 70–99)
Glucose-Capillary: 260 mg/dL — ABNORMAL HIGH (ref 70–99)
Glucose-Capillary: 233 mg/dL — ABNORMAL HIGH (ref 70–99)
Glucose-Capillary: 186 mg/dL — ABNORMAL HIGH (ref 70–99)
Glucose-Capillary: 149 mg/dL — ABNORMAL HIGH (ref 70–99)

## 2019-12-14 LAB — BASIC METABOLIC PANEL
Anion gap: 14 (ref 5–15)
BUN: 17 mg/dL (ref 6–20)
CO2: 24 mmol/L (ref 22–32)
Calcium: 8.7 mg/dL — ABNORMAL LOW (ref 8.9–10.3)
Chloride: 98 mmol/L (ref 98–111)
Creatinine, Ser: 0.59 mg/dL (ref 0.44–1.00)
GFR calc Af Amer: >60 mL/min (ref >60)
GFR calc non Af Amer: >60 mL/min (ref >60)
Glucose, Bld: 203 mg/dL — ABNORMAL HIGH (ref 70–99)
Potassium: 4.1 mmol/L (ref 3.5–5.1)
Sodium: 136 mmol/L (ref 135–145)

## 2019-12-14 LAB — TROPONIN I (HIGH SENSITIVITY)
Troponin I (High Sensitivity): 2801 ng/L (ref <18)
Troponin I (High Sensitivity): 4507 ng/L (ref <18)
Troponin I (High Sensitivity): 5704 ng/L (ref <18)

## 2019-12-14 LAB — ECHOCARDIOGRAM COMPLETE
Height: 66.5 in
Weight: 5248.71 oz

## 2019-12-14 LAB — MRSA PCR SCREENING: MRSA by PCR: NEGATIVE

## 2019-12-14 LAB — CBC
HCT: 44.8 % (ref 36.0–46.0)
Hemoglobin: 14.4 g/dL (ref 12.0–15.0)
MCH: 27.1 pg (ref 26.0–34.0)
MCHC: 32.1 g/dL (ref 30.0–36.0)
MCV: 84.4 fL (ref 80.0–100.0)
Platelets: 285 10*3/uL (ref 150–400)
RBC: 5.31 MIL/uL — ABNORMAL HIGH (ref 3.87–5.11)
RDW: 13.6 % (ref 11.5–15.5)
WBC: 10.2 10*3/uL (ref 4.0–10.5)
nRBC: 0 % (ref 0.0–0.2)

## 2019-12-14 LAB — POCT ACTIVATED CLOTTING TIME: Activated Clotting Time: 400 seconds

## 2019-12-14 LAB — HIV ANTIBODY (ROUTINE TESTING W REFLEX): HIV Screen 4th Generation wRfx: NONREACTIVE

## 2019-12-14 SURGERY — CORONARY/GRAFT ACUTE MI REVASCULARIZATION
Anesthesia: LOCAL

## 2019-12-14 MED ORDER — HEPARIN SODIUM (PORCINE) 1000 UNIT/ML IJ SOLN
INTRAMUSCULAR | Status: AC
  Filled 2019-12-14: qty 1

## 2019-12-14 MED ORDER — FENTANYL CITRATE (PF) 100 MCG/2ML IJ SOLN
INTRAMUSCULAR | Status: AC
  Filled 2019-12-14: qty 2

## 2019-12-14 MED ORDER — LIDOCAINE HCL (PF) 1 % IJ SOLN
INTRAMUSCULAR | Status: DC | PRN
  Administered 2019-12-14: 2 mL

## 2019-12-14 MED ORDER — TICAGRELOR 90 MG PO TABS
ORAL_TABLET | ORAL | Status: AC
  Filled 2019-12-14: qty 2

## 2019-12-14 MED ORDER — TICAGRELOR 90 MG PO TABS
90.0000 mg | ORAL_TABLET | Freq: Two times a day (BID) | ORAL | Status: DC
  Administered 2019-12-14 – 2019-12-16 (×5): 90 mg via ORAL
  Filled 2019-12-14 (×5): qty 1

## 2019-12-14 MED ORDER — SODIUM CHLORIDE 0.9 % IV SOLN: 250.0000 mL | INTRAVENOUS | Status: DC | PRN

## 2019-12-14 MED ORDER — TIROFIBAN (AGGRASTAT) BOLUS VIA INFUSION
INTRAVENOUS | Status: DC | PRN
  Administered 2019-12-14: 3737.5 ug via INTRAVENOUS

## 2019-12-14 MED ORDER — MIDAZOLAM HCL 2 MG/2ML IJ SOLN
INTRAMUSCULAR | Status: AC
  Filled 2019-12-14: qty 2

## 2019-12-14 MED ORDER — SODIUM CHLORIDE 0.9 % IV SOLN
INTRAVENOUS | Status: AC | PRN
  Administered 2019-12-14: 10 mL/h via INTRAVENOUS

## 2019-12-14 MED ORDER — PERFLUTREN LIPID MICROSPHERE
1.0000 mL | INTRAVENOUS | Status: AC | PRN
  Administered 2019-12-14: 3 mL via INTRAVENOUS
  Filled 2019-12-14: qty 10

## 2019-12-14 MED ORDER — METOPROLOL TARTRATE 12.5 MG HALF TABLET
12.5000 mg | ORAL_TABLET | Freq: Two times a day (BID) | ORAL | Status: DC
  Administered 2019-12-14 – 2019-12-15 (×4): 12.5 mg via ORAL
  Filled 2019-12-14 (×4): qty 1

## 2019-12-14 MED ORDER — IOHEXOL 350 MG/ML SOLN
INTRAVENOUS | Status: DC | PRN
  Administered 2019-12-14: 150 mL via INTRA_ARTERIAL

## 2019-12-14 MED ORDER — SODIUM CHLORIDE 0.9% FLUSH: 3.0000 mL | INTRAVENOUS | Status: DC | PRN

## 2019-12-14 MED ORDER — ATORVASTATIN CALCIUM 80 MG PO TABS
80.0000 mg | ORAL_TABLET | Freq: Every day | ORAL | Status: DC
  Administered 2019-12-14 – 2019-12-15 (×3): 80 mg via ORAL
  Filled 2019-12-14 (×3): qty 1

## 2019-12-14 MED ORDER — SODIUM CHLORIDE 0.9% FLUSH
3.0000 mL | Freq: Two times a day (BID) | INTRAVENOUS | Status: DC
  Administered 2019-12-14 – 2019-12-16 (×3): 3 mL via INTRAVENOUS

## 2019-12-14 MED ORDER — HEPARIN (PORCINE) IN NACL 1000-0.9 UT/500ML-% IV SOLN
INTRAVENOUS | Status: AC
  Filled 2019-12-14: qty 1500

## 2019-12-14 MED ORDER — VERAPAMIL HCL 2.5 MG/ML IV SOLN
INTRAVENOUS | Status: AC
  Filled 2019-12-14: qty 2

## 2019-12-14 MED ORDER — HYDRALAZINE HCL 20 MG/ML IJ SOLN: 10.0000 mg | INTRAMUSCULAR | Status: AC | PRN

## 2019-12-14 MED ORDER — VERAPAMIL HCL 2.5 MG/ML IV SOLN
INTRAVENOUS | Status: DC | PRN
  Administered 2019-12-14: 10 mL via INTRA_ARTERIAL

## 2019-12-14 MED ORDER — IOHEXOL 350 MG/ML SOLN
INTRAVENOUS | Status: AC
  Filled 2019-12-14: qty 1

## 2019-12-14 MED ORDER — TIROFIBAN HCL IN NACL 5-0.9 MG/100ML-% IV SOLN
INTRAVENOUS | Status: AC | PRN
  Administered 2019-12-14: 0.15 ug/kg/min via INTRAVENOUS

## 2019-12-14 MED ORDER — MIDAZOLAM HCL 2 MG/2ML IJ SOLN
INTRAMUSCULAR | Status: DC | PRN
  Administered 2019-12-14: 2 mg via INTRAVENOUS

## 2019-12-14 MED ORDER — HEPARIN SODIUM (PORCINE) 1000 UNIT/ML IJ SOLN
INTRAMUSCULAR | Status: DC | PRN
  Administered 2019-12-14: 10000 [IU] via INTRAVENOUS

## 2019-12-14 MED ORDER — ASPIRIN 81 MG PO CHEW
81.0000 mg | CHEWABLE_TABLET | Freq: Every day | ORAL | Status: DC
  Administered 2019-12-14 – 2019-12-16 (×3): 81 mg via ORAL
  Filled 2019-12-14 (×3): qty 1

## 2019-12-14 MED ORDER — LIDOCAINE HCL (PF) 1 % IJ SOLN
INTRAMUSCULAR | Status: AC
  Filled 2019-12-14: qty 30

## 2019-12-14 MED ORDER — TIROFIBAN HCL IN NACL 5-0.9 MG/100ML-% IV SOLN
INTRAVENOUS | Status: AC
  Filled 2019-12-14: qty 100

## 2019-12-14 MED ORDER — TICAGRELOR 90 MG PO TABS
ORAL_TABLET | ORAL | Status: DC | PRN
  Administered 2019-12-14: 180 mg via ORAL

## 2019-12-14 MED ORDER — TIROFIBAN HCL IN NACL 5-0.9 MG/100ML-% IV SOLN
0.1500 ug/kg/min | INTRAVENOUS | Status: AC
  Administered 2019-12-14 (×4): 0.15 ug/kg/min via INTRAVENOUS
  Filled 2019-12-14 (×3): qty 100

## 2019-12-14 MED ORDER — HEPARIN (PORCINE) IN NACL 1000-0.9 UT/500ML-% IV SOLN
INTRAVENOUS | Status: DC | PRN
  Administered 2019-12-14 (×3): 500 mL

## 2019-12-14 MED ORDER — SODIUM CHLORIDE 0.9 % IV SOLN: INTRAVENOUS | Status: AC

## 2019-12-14 MED ORDER — LABETALOL HCL 5 MG/ML IV SOLN: 10.0000 mg | INTRAVENOUS | Status: AC | PRN

## 2019-12-14 MED ORDER — CHLORHEXIDINE GLUCONATE CLOTH 2 % EX PADS
6.0000 | MEDICATED_PAD | Freq: Every day | CUTANEOUS | Status: DC
  Administered 2019-12-14: 10:00:00 6 via TOPICAL

## 2019-12-14 MED ORDER — FENTANYL CITRATE (PF) 100 MCG/2ML IJ SOLN
INTRAMUSCULAR | Status: DC | PRN
  Administered 2019-12-14: 50 ug via INTRAVENOUS

## 2019-12-14 SURGICAL SUPPLY — 20 items
BALLN SAPPHIRE 2.5X12 (BALLOONS) ×2
BALLN SAPPHIRE ~~LOC~~ 3.25X12 (BALLOONS) ×1 IMPLANT
BALLN SAPPHIRE ~~LOC~~ 3.5X12 (BALLOONS) ×1 IMPLANT
BALLOON SAPPHIRE 2.5X12 (BALLOONS) IMPLANT
CATH 5FR JL3.5 JR4 ANG PIG MP (CATHETERS) ×1 IMPLANT
CATH LAUNCHER 6FR JR4 (CATHETERS) ×1 IMPLANT
DEVICE RAD COMP TR BAND LRG (VASCULAR PRODUCTS) ×1 IMPLANT
GLIDESHEATH SLEND SS 6F .021 (SHEATH) ×1 IMPLANT
GUIDEWIRE INQWIRE 1.5J.035X260 (WIRE) IMPLANT
HOVERMATT SINGLE USE (MISCELLANEOUS) ×1 IMPLANT
INQWIRE 1.5J .035X260CM (WIRE) ×2
KIT ENCORE 26 ADVANTAGE (KITS) ×1 IMPLANT
KIT HEART LEFT (KITS) ×2 IMPLANT
PACK CARDIAC CATHETERIZATION (CUSTOM PROCEDURE TRAY) ×2 IMPLANT
STENT SYNERGY XD 3.0X16 (Permanent Stent) IMPLANT
SYNERGY XD 3.0X16 (Permanent Stent) ×2 IMPLANT
SYR MEDRAD MARK 7 150ML (SYRINGE) ×1 IMPLANT
TRANSDUCER W/STOPCOCK (MISCELLANEOUS) ×2 IMPLANT
TUBING CIL FLEX 10 FLL-RA (TUBING) ×2 IMPLANT
WIRE COUGAR XT STRL 190CM (WIRE) ×1 IMPLANT

## 2019-12-14 NOTE — Significant Event (Signed)
Rapid Response Event Note  Overview: CP with STE in inferior leads  Initial Focused Assessment: While rounding on the department, nursing staff notified me of new pt with CP and STE on EKG. Staff stated that Dr. Neena Rhymes was already notified and on the way. Pt was alert, c/o CP 4/10 after SL NTG. 12 lead EKG showed STE in inferior leads. Code Stemi activated by Dr. Neena Rhymes. Zoll pads and defibrillator attached to patient. Consent obtained and pt transported to the Cath lab ASAP. Pt had already received ASA at 1650 2/11 and was on a heparin gtt at 1650 units per hour.   0025-98.3 F, HR 71 SR, 170/92, RR 16 with sats 97% on 2LNC  Interventions: -Code Stemi -transported to CCL   Event Summary: Notified 0025 Call ended 0110  Madelynn Done

## 2019-12-14 NOTE — Progress Notes (Addendum)
CARDIAC REHAB PHASE I   Began education with pt and her friend. Discussed MI, stent, restrictions, Brilinta, diet and CRPII. Receptive. Understands the importance of Brilinta. Lost 43 lbs on weight watchers but gained 30 back due to covid pandemic. She is beginning to think about getting back on track. Will refer to [place]. She is nervous of cost and will do virtual program if Medicaid does not cover. I will let her rest and have room activity today and return tomorrow to ambulate and give exercise guidelines. She stated she did go to bathroom and became fatigued.  Pt is interested in participating in Virtual Cardiac and Pulmonary Rehab. Pt advised that Virtual Cardiac and Pulmonary Rehab is provided at no cost to the patient.  Checklist:  1. Pt has smart device  ie smartphone and/or ipad for downloading an app  Yes 2. Reliable internet/wifi service    Yes 3. Understands how to use their smartphone and navigate within an app.  Yes Pt verbalized understanding and is in agreement.  [place], ACSM 12/14/2019 2:00 PM

## 2019-12-14 NOTE — Procedures (Signed)
Echo attempted. Patient with patient educator. Will attempt again later.

## 2019-12-14 NOTE — Plan of Care (Signed)

## 2019-12-14 NOTE — Progress Notes (Addendum)
Progress Note  Patient Name: Kristin Reilly Date of Encounter: 12/14/2019  Primary Cardiologist: No primary care provider on file.   Patient profile  Kristin Reilly is a 54 y.o. female with morbid obesity, DMII, HLD,HTN, presented to [facility] long hospital with chest pain. She transferred to [facility] hospital for STEMI (Elevated cardiac enzymes, Initial EKG did not show any ST elevation but repeat EKG showed ST elevation in inferior leads). She underwent successful PCI and stent to RCA.  Subjective   Patient was seen and evaluated at bedside this morning. No acute events overnight and after cath. She mentions that she is doing very well. Has not had any further chest pain. No SOB, no palpitation. She feels much better than yesterday.  Inpatient Medications    Scheduled Meds:  vitamin C  500 mg Oral Daily   aspirin  81 mg Oral Daily   atorvastatin  80 mg Oral q1800   celecoxib  200 mg Oral Daily   Chlorhexidine Gluconate Cloth  6 each Topical Daily   cholecalciferol  1,000 Units Oral Daily   hydrochlorothiazide  25 mg Oral Daily   insulin aspart  0-5 Units Subcutaneous QHS   insulin aspart  0-9 Units Subcutaneous TID WC   irbesartan  75 mg Oral Daily   sodium chloride flush  3 mL Intravenous Once   sodium chloride flush  3 mL Intravenous Q12H   ticagrelor  90 mg Oral BID   Continuous Infusions:  sodium chloride 75 mL/hr at 12/14/19 0500   sodium chloride     tirofiban 0.15 mcg/kg/min (12/14/19 0654)   PRN Meds: sodium chloride, acetaminophen, amitriptyline, hydrALAZINE, hydrALAZINE, labetalol, morphine injection, nitroGLYCERIN, ondansetron (ZOFRAN) IV, sodium chloride flush   Vital Signs    Vitals:   12/14/19 0415 12/14/19 0430 12/14/19 0445 12/14/19 0500  BP: 131/78 134/76 (!) 142/73 (!) 149/73  Pulse: 69 71 76 77  Resp: 15 14 16 13   Temp:      TempSrc:      SpO2: 96% 98% 99% 99%  Weight:      Height:        Intake/Output Summary (Last 24 hours) at 12/14/2019  0657 Last data filed at 12/14/2019 0500 Gross per 24 hour  Intake 180.84 ml  Output 300 ml  Net -119.16 ml   Last 3 Weights 12/14/2019 12/13/2019 12/13/2019  Weight (lbs) 328 lb 0.7 oz 329 lb 9.4 oz 330 lb  Weight (kg) 148.8 kg 149.5 kg 149.687 kg      Telemetry    Normal sinus- Personally Reviewed  ECG   ST-elevation in II,III, AVF - Personally Reviewed  Physical Exam   GEN: Obese lady, in no acute distress.   Neck: No JVD Cardiac: RRR, no murmurs, rubs, or gallops.  Respiratory: Clear to auscultation bilaterally. GI: Soft, nontender, non-distended  MS: No edema; No deformity. Neuro:  Nonfocal  Psych: Normal affect   Labs    High Sensitivity Troponin:   Recent Labs  Lab 12/13/19 1638 12/13/19 1903 12/13/19 2149 12/14/19 0454  TROPONINIHS 273* 297* 513* 2,801*      Chemistry Recent Labs  Lab 12/13/19 1638 12/14/19 0454  NA 137 136  K 3.9 4.1  CL 98 98  CO2 26 24  GLUCOSE 240* 203*  BUN 25* 17  CREATININE 0.57 0.59  CALCIUM 9.2 8.7*  PROT 7.9  --   ALBUMIN 4.0  --   AST 31  --   ALT 35  --   ALKPHOS 132*  --  BILITOT 0.7  --   GFRNONAA >60 >60  GFRAA >60 >60  ANIONGAP 13 14     Hematology Recent Labs  Lab 12/13/19 1638 12/14/19 0454  WBC 9.2 10.2  RBC 5.59* 5.31*  HGB 15.2* 14.4  HCT 46.7* 44.8  MCV 83.5 84.4  MCH 27.2 27.1  MCHC 32.5 32.1  RDW 13.4 13.6  PLT 256 285    BNPNo results for input(s): BNP, PROBNP in the last 168 hours.   DDimer  Recent Labs  Lab 12/13/19 1638  DDIMER 0.42     Radiology    DG Chest 2 View  Result Date: 12/13/2019 CLINICAL DATA:  Chest pain. EXAM: CHEST - 2 VIEW COMPARISON:  December 30, 2014. FINDINGS: The heart size and mediastinal contours are within normal limits. Both lungs are clear. No pneumothorax or pleural effusion is noted. The visualized skeletal structures are unremarkable. IMPRESSION: No active cardiopulmonary disease. Electronically Signed   By: Marijo Conception M.D.   On: 12/13/2019  16:59   CARDIAC CATHETERIZATION  Result Date: 12/14/2019  Prox RCA-1 lesion is 30% stenosed.  Prox RCA-2 lesion is 100% stenosed.  Mid Cx to Dist Cx lesion is 30% stenosed.  Dist LAD lesion is 30% stenosed.  A drug-eluting stent was successfully placed using a SYNERGY XD 3.0X16.  Post intervention, there is a 0% residual stenosis.  1. Inferior STEMI secondary to thrombotic occlusion of the mid RCA 2. Successful PTCA/DES x 1 mid RCA 3. Mild non-obstructive disease in the proximal RCA, distal Circumflex and distal LAD 4. Elevated LVEDP Recommendations: Will continue Aggrastat drip for 12 hours given heavy thrombus burden within the stented segment. Continue ASA and Brilinta for one year. Echo later today. High intensity statin. Beta blocker as heart rate tolerates.    Cardiac Studies   Heart Cath 12/14/2019 Prox RCA-1 lesion is 30% stenosed. Prox RCA-2 lesion is 100% stenosed. Mid Cx to Dist Cx lesion is 30% stenosed. Dist LAD lesion is 30% stenosed. A drug-eluting stent was successfully placed using a SYNERGY XD 3.0X16. Post intervention, there is a 0% residual stenosis.   1. Inferior STEMI secondary to thrombotic occlusion of the mid RCA 2. Successful PTCA/DES x 1 mid RCA 3. Mild non-obstructive disease in the proximal RCA, distal Circumflex and distal LAD 4. Elevated LVEDP   Recommendations: Will continue Aggrastat drip for 12 hours given heavy thrombus burden within the stented segment. Continue ASA and Brilinta for one year. Echo later today. High intensity statin. Beta blocker as heart rate tolerates.     Diagnostic Dominance: Right  Intervention    Patient Profile     54 y.o. female with morbid obesity, DM, HLD presenting with chest pain, found to have STEMI. Now s/p PCI to RCA with good result.   Assessment & Plan    Kristin Reilly is a 54 y.o. female with morbid obesity, DMII, HLD,HTN, presented to [facility] long hospital with chest pain. Had elevated Trop, but  Initially no ST elevation. She had continuous chest pain, and repeat EKG showed ST elevation in inferior leads. She transferred to [facility] hospital for inferior STEMI and underwent successful PCI and stent to RCA.   Acute inferior STEMI: S/p PCI and successful stent in RCA (performed ~ 1AM 2/12/). Has been symptoms free since then. Vitals stable and specifically, BP at 140s/80s with no hypotension in setting of inferior MI.   Uncomplicated inferior MI and anticipating fast track. Will keep her in CCU overnight for close monitoring post cath day  1. Likely transfer to cardiac tele tomorrow and anticipating DC home on Sunday.   -Continue ASA 81mg  daily - Continue Brilinta 90mg  BID - Continue atorvastatin 80mg  QHS - Cont Irbesartan 75 mg qd (Pt is on Valsartan at home) - Adding Metoprolol tartrate 12.5 mg BID - Aggrastate for 12 h after cath (given the thrombus on cath) - Continue sublingual nitro (Does not have any hypotension) - Cardiac rehab - f/u echo     DMII: On Metformin at home -Hb A1c 7.5 -SSI when in hospital -CBG monitoring - [place] metformin for 48 h after cath(Can resume Sunday morning) - Would benefit from starting SGLT2 inhibitor given known CAD    HTN: BP at 140s/80s - On HCTZ and Valsartan at home -Holding HCTZ as we started patient on BB and keep on ARB      For questions or updates, please contact [place] Please consult [email or web address] for contact info under        Signed, Dewayne Hatch, MD  12/14/2019, 6:57 AM    Agree with not eby Dr Myrtie Hawk  Ms. Shake was admitted with an inferior STEMI.  She underwent PCI and stenting of her mid dominant RCA with drug-eluting stent.  Her left system was free of disease.  She did have visible thrombus and was placed on Aggrastat for 12 hours.  She is had no residual symptoms.  Her right radial puncture site is stable.  She is hemodynamically stable.  There were no arrhythmias.  Her exam otherwise is benign.  She is  on appropriate medications.  2D echo is pending.  She will be transferred to a telemetry bed tomorrow with anticipation of discharge the following day as a "fast track".  Lorretta Harp, M.D., [place], [facility], [place] [address] [place], [place]  [id number]  [phone] 12/14/2019 12:14 PM

## 2019-12-14 NOTE — Progress Notes (Signed)
EKG performed. Acute MI/STEMI noted. Rapid response RN in room assessing. Cardiologist notified will come to unit and assess patient and verify EKG.

## 2019-12-14 NOTE — H&P (Signed)
Cardiology Admission History and Physical:   Patient ID: Kristin Reilly MRN: [id number]; DOB: 1966-01-30   Admission date: 12/13/2019  Primary Care Provider: Berkley Harvey, NP Primary Cardiologist: No primary care provider on file.  Primary Electrophysiologist:  None   Chief Complaint:  Chest pain  Patient Profile:   Kristin Reilly is a 54 y.o. female with morbid obesity, DM, HLD presenting with chest pain.   History of Present Illness:   Kristin Reilly presents with chest pain starting abruptly about 24 hours ago. See H&P from Dr. Marlowe Sax for full details of her presentation.   Patient presented to [facility] ED and was diagnosed with NSTEMI. She was treated with aspirin, morphine, and heparin. Her ECG was without ST elevations. She was admitted to [facility] with plans for cardiology consultation in the AM. The patient's chest pain however persisted and her admitting doctor requested transfer to [facility] given the high likelihood that she would eventually undergo cardiac catheterization.   On arrival to [facility], the patient's ECG revealed evolving ST elevations in the inferior limb leads. She continued reporting 4/10 chest pain despite receiving IV morphine. The cath lab was called in for acute STEMI.   Heart Pathway Score:     Past Medical History:  Diagnosis Date  . Arthritis   . Depression   . Glaucoma   . Hyperlipidemia   . Hypertension   . Sleep apnea    no cpap    Past Surgical History:  Procedure Laterality Date  . APPENDECTOMY    . COLONOSCOPY N/A 08/05/2015   Procedure: COLONOSCOPY;  Surgeon: Jerene Bears, MD;  Location: [facility] ENDOSCOPY;  Service: Gastroenterology;  Laterality: N/A;  . COLONOSCOPY WITH PROPOFOL N/A 01/09/2019   Procedure: COLONOSCOPY WITH PROPOFOL;  Surgeon: Jerene Bears, MD;  Location: [facility] ENDOSCOPY;  Service: Gastroenterology;  Laterality: N/A;  . POLYPECTOMY  01/09/2019   Procedure: POLYPECTOMY;  Surgeon:  Jerene Bears, MD;  Location: [facility] ENDOSCOPY;  Service: Gastroenterology;;  . TONSILLECTOMY    . WISDOM TOOTH EXTRACTION       Medications Prior to Admission: Prior to Admission medications   Medication Sig Start Date End Date Taking? Authorizing Provider  amitriptyline (ELAVIL) 25 MG tablet Take 25 mg by mouth at bedtime as needed for sleep.  09/05/18  Yes [provider]  celecoxib (CELEBREX) 200 MG capsule Take 200 mg by mouth daily.  05/24/17  Yes [provider]  cholecalciferol (VITAMIN D3) 25 MCG (1000 UT) tablet Take 1,000 Units by mouth daily.   Yes [provider]  hydrochlorothiazide (HYDRODIURIL) 25 MG tablet Take 25 mg by mouth daily.    Yes [provider]  metFORMIN (GLUCOPHAGE-XR) 500 MG 24 hr tablet Take 500 mg by mouth 2 (two) times daily with a meal.  09/22/18  Yes [provider]  NON FORMULARY Place 1 drop into both eyes daily.   Yes [provider]  nystatin (MYCOSTATIN/NYSTOP) powder Apply 1 Bottle topically daily as needed (irritation).  06/05/18  Yes [provider]  pravastatin (PRAVACHOL) 10 MG tablet Take 20 mg by mouth daily.  09/06/18  Yes [provider]  valsartan (DIOVAN) 80 MG tablet Take 80 mg by mouth daily.  09/06/18  Yes [provider]  vitamin C (ASCORBIC ACID) 500 MG tablet Take 500 mg by mouth daily.   Yes [provider]  ACCU-CHEK SOFTCLIX LANCETS lancets 1 each by Misc.(Non-Drug; Combo Route) route daily. 09/06/18  [provider]  Blood Glucose Monitoring Suppl (GLUCOCOM BLOOD GLUCOSE MONITOR) DEVI 1 each by Misc.(Non-Drug; Combo Route) route daily. Include strips. Lancets, lancet device, control solution. batteries 09/06/18   [provider]  glucose blood test strip 1 each by Misc.(Non-Drug; Combo Route) route daily. 09/06/18   [provider]     Allergies:    Allergies  Allergen Reactions  . Fenofibrate Nausea And Vomiting  . Tylox  [Oxycodone-Acetaminophen] Other (See Comments)    Migraine HA and GI upset    Social History:   Social History   Socioeconomic History  . Marital status: Divorced    Spouse name: Not on file  . Number of children: Not on file  . Years of education: Not on file  . Highest education level: Not on file  Occupational History  . Not on file  Tobacco Use  . Smoking status: Former Smoker    Quit date: 07/21/2005    Years since quitting: 14.4  . Smokeless tobacco: Never Used  . Tobacco comment: quit 2008  Substance and Sexual Activity  . Alcohol use: Yes    Alcohol/week: 0.0 standard drinks    Comment: occasionally  . Drug use: No  . Sexual activity: Yes    Birth control/protection: I.U.D.  Other Topics Concern  . Not on file  Social History Narrative  . Not on file   Social Determinants of Health   Financial Resource Strain:   . Difficulty of Paying Living Expenses: Not on file  Food Insecurity:   . Worried About Charity fundraiser in the Last Year: Not on file  . Ran Out of Food in the Last Year: Not on file  Transportation Needs:   . Lack of Transportation (Medical): Not on file  . Lack of Transportation (Non-Medical): Not on file  Physical Activity:   . Days of Exercise per Week: Not on file  . Minutes of Exercise per Session: Not on file  Stress:   . Feeling of Stress : Not on file  Social Connections:   . Frequency of Communication with Friends and Family: Not on file  . Frequency of Social Gatherings with Friends and Family: Not on file  . Attends Religious Services: Not on file  . Active Member of Clubs or Organizations: Not on file  . Attends Archivist Meetings: Not on file  . Marital Status: Not on file  Intimate Partner Violence:   . Fear of Current or Ex-Partner: Not on file  . Emotionally Abused: Not on file  . Physically Abused: Not on file  . Sexually Abused: Not on file    Family History:   The patient's family history includes ADD /  ADHD in her son; Alcoholism in her brother; Aneurysm in her maternal grandmother; Bipolar disorder in her sister; CVA in her mother; Cerebral aneurysm in her mother; Clotting disorder in her mother; Mental illness in her sister; Throat cancer in her maternal grandfather. There is no history of Colon cancer, Breast cancer, Esophageal cancer, Rectal cancer, or Stomach cancer.    ROS:  Please see the history of present illness.  All other ROS reviewed and negative.     Physical Exam/Data:   Vitals:   12/14/19 0017 12/14/19 0030 12/14/19 0035 12/14/19 0128  BP: (!) 170/92     Pulse: 71 75 65   Resp:      Temp:      TempSrc:      SpO2: 94% 97% 97% 97%  Weight:  Height:       No intake or output data in the 24 hours ending 12/14/19 0222 Last 3 Weights 12/13/2019 12/13/2019 04/24/2019  Weight (lbs) 329 lb 9.4 oz 330 lb 302 lb 9.6 oz  Weight (kg) 149.5 kg 149.687 kg 137.258 kg     Body mass index is 52.4 kg/m.  General:  Obese, well developed, in no acute distress HEENT: normal Lymph: no adenopathy Neck: no JVD Endocrine:  No thryomegaly Vascular: No carotid bruits; FA pulses 2+ bilaterally without bruits  Cardiac:  normal S1, S2; RRR; no murmur, distant heart sounds Lungs:  clear to auscultation bilaterally, no wheezing, rhonchi or rales  Abd: soft, nontender, no hepatomegaly  Ext: no edema Musculoskeletal:  No deformities, BUE and BLE strength normal and equal Skin: warm and dry  Neuro:  CNs 2-12 intact, no focal abnormalities noted Psych:  Normal affect    EKG:  The ECG that was done on admission to [facility] was personally reviewed and demonstrates ST elevation in leads II, III, aVF with ST depression in I and aVL consistent with acute STEMI  Relevant CV Studies: None  Laboratory Data:  High Sensitivity Troponin:   Recent Labs  Lab 12/13/19 1638 12/13/19 1903 12/13/19 2149  TROPONINIHS 273* 297* 513*      Chemistry Recent Labs  Lab 12/13/19 1638  NA 137  K  3.9  CL 98  CO2 26  GLUCOSE 240*  BUN 25*  CREATININE 0.57  CALCIUM 9.2  GFRNONAA >60  GFRAA >60  ANIONGAP 13    Recent Labs  Lab 12/13/19 1638  PROT 7.9  ALBUMIN 4.0  AST 31  ALT 35  ALKPHOS 132*  BILITOT 0.7   Hematology Recent Labs  Lab 12/13/19 1638  WBC 9.2  RBC 5.59*  HGB 15.2*  HCT 46.7*  MCV 83.5  MCH 27.2  MCHC 32.5  RDW 13.4  PLT 256   BNPNo results for input(s): BNP, PROBNP in the last 168 hours.  DDimer  Recent Labs  Lab 12/13/19 1638  DDIMER 0.42     Radiology/Studies:  DG Chest 2 View  Result Date: 12/13/2019 CLINICAL DATA:  Chest pain. EXAM: CHEST - 2 VIEW COMPARISON:  December 30, 2014. FINDINGS: The heart size and mediastinal contours are within normal limits. Both lungs are clear. No pneumothorax or pleural effusion is noted. The visualized skeletal structures are unremarkable. IMPRESSION: No active cardiopulmonary disease. Electronically Signed   By: Marijo Conception M.D.   On: 12/13/2019 16:59       TIMI Risk Score for ST  Elevation MI:   The patient's TIMI risk score is 1, which indicates a 1.6% risk of all cause mortality at 30 days.    Assessment and Plan:   Kristin Reilly is a 54 y.o. female with morbid obesity, DM, HLD presenting with chest pain, found to have STEMI. Now s/p PCI to RCA with good result.   #) STEMI, CAD: inferior STEMI  Diagnostics: - echo in AM - check lipids, A1c  Therapeutics: - ASA 81mg  daily - switch pravastatin to atorvastatin 80mg  QHS - cont home valsartan - SLN, nitro gtt PRN - ticagrelor 90mg  BID - cardiac rehab - consider starting SGLT2 inhibitor as per below  #) DM: - restart home metformin in AM - would benefit from starting SGLT2 inhibitor given known CAD  #) HTN: - cont home HCTZ  Severity of Illness: The appropriate patient status for this patient is INPATIENT. Inpatient status is judged to be  reasonable and necessary in order to provide the required intensity of service to  ensure the patient's safety. The patient's presenting symptoms, physical exam findings, and initial radiographic and laboratory data in the context of their chronic comorbidities is felt to place them at high risk for further clinical deterioration. Furthermore, it is not anticipated that the patient will be medically stable for discharge from the hospital within 2 midnights of admission. The following factors support the patient status of inpatient.   " The patient's presenting symptoms include Chest pain. " The worrisome physical exam findings include morbid obesity. " The initial radiographic and laboratory data are worrisome because of elevated troponin. " The chronic co-morbidities include morbid obesity.   * I certify that at the point of admission it is my clinical judgment that the patient will require inpatient hospital care spanning beyond 2 midnights from the point of admission due to high intensity of service, high risk for further deterioration and high frequency of surveillance required.*    For questions or updates, please contact [place] Please consult [email or web address] for contact info under        Signed, Marcie Mowers, MD  12/14/2019 2:22 AM

## 2019-12-14 NOTE — Progress Notes (Signed)
Patient transported off unit with cardiac catherization team, rapid response nurse and charge nurse. Patient alert and oriented upon departure from floor. Oxygen on via [place] @ 2 liters. Heparin infusing.  On portable EKG monitor.

## 2019-12-14 NOTE — Progress Notes (Signed)
CRITICAL VALUE ALERT  Critical Value: Troponin 2000    Orders Received/Actions taken: expected value. Patient denies chest pain. Will monitor closely

## 2019-12-14 NOTE — Progress Notes (Signed)
Chaplain engaged in initial visit with Ms. Kristin Reilly.  Chaplain wanted to check-in with Ms. Kristin Reilly after Cath.  Chaplain was able to offer support, listening ear, and offer a blessing over the rest of [name]'s time here.  Chaplain is available to provide support.

## 2019-12-14 NOTE — Progress Notes (Signed)
STEMI response team activated. Dr. Neena Rhymes in room speaking with patient. Plan to transfer patient to cardiac cath lab ASAP.

## 2019-12-15 DIAGNOSIS — I2119 ST elevation (STEMI) myocardial infarction involving other coronary artery of inferior wall: Principal | ICD-10-CM

## 2019-12-15 DIAGNOSIS — E119 Type 2 diabetes mellitus without complications: Secondary | ICD-10-CM

## 2019-12-15 LAB — CBC
HCT: 43.5 % (ref 36.0–46.0)
Hemoglobin: 14 g/dL (ref 12.0–15.0)
MCH: 27.6 pg (ref 26.0–34.0)
MCHC: 32.2 g/dL (ref 30.0–36.0)
MCV: 85.6 fL (ref 80.0–100.0)
Platelets: 250 10*3/uL (ref 150–400)
RBC: 5.08 MIL/uL (ref 3.87–5.11)
RDW: 13.9 % (ref 11.5–15.5)
WBC: 9.4 10*3/uL (ref 4.0–10.5)
nRBC: 0 % (ref 0.0–0.2)

## 2019-12-15 LAB — BASIC METABOLIC PANEL
Anion gap: 13 (ref 5–15)
BUN: 21 mg/dL — ABNORMAL HIGH (ref 6–20)
CO2: 23 mmol/L (ref 22–32)
Calcium: 8.7 mg/dL — ABNORMAL LOW (ref 8.9–10.3)
Chloride: 100 mmol/L (ref 98–111)
Creatinine, Ser: 0.68 mg/dL (ref 0.44–1.00)
GFR calc Af Amer: >60 mL/min (ref >60)
GFR calc non Af Amer: >60 mL/min (ref >60)
Glucose, Bld: 249 mg/dL — ABNORMAL HIGH (ref 70–99)
Potassium: 4 mmol/L (ref 3.5–5.1)
Sodium: 136 mmol/L (ref 135–145)

## 2019-12-15 LAB — GLUCOSE, CAPILLARY
Glucose-Capillary: 170 mg/dL — ABNORMAL HIGH (ref 70–99)
Glucose-Capillary: 182 mg/dL — ABNORMAL HIGH (ref 70–99)
Glucose-Capillary: 201 mg/dL — ABNORMAL HIGH (ref 70–99)
Glucose-Capillary: 161 mg/dL — ABNORMAL HIGH (ref 70–99)

## 2019-12-15 NOTE — Progress Notes (Signed)
CARDIAC REHAB PHASE I   PRE:  Rate/Rhythm:     BP: sitting     SaO2:   MODE:  Ambulation: 500 ft   POST:  Rate/Rhythm: 110 ST    BP: sitting 120/81     SaO2: 96 RA  Pt up independently in hall. I joined her, she feels well, no c/o. VSS. Reinforced education from yesterday and gave her exercise guidelines for d/c. Discussed NTG. Voiced reception and sts that this MI is a catalyst for change.  [place], ACSM 12/15/2019 11:48 AM

## 2019-12-15 NOTE — Progress Notes (Signed)
Progress Note  Patient Name: Kristin Reilly Date of Encounter: 12/15/2019  Primary Cardiologist: No primary care provider on file. New  Subjective   Feels much better. Has not walked yet.  Inpatient Medications    Scheduled Meds: . vitamin C  500 mg Oral Daily  . aspirin  81 mg Oral Daily  . atorvastatin  80 mg Oral q1800  . celecoxib  200 mg Oral Daily  . Chlorhexidine Gluconate Cloth  6 each Topical Daily  . cholecalciferol  1,000 Units Oral Daily  . insulin aspart  0-5 Units Subcutaneous QHS  . insulin aspart  0-9 Units Subcutaneous TID WC  . irbesartan  75 mg Oral Daily  . metoprolol tartrate  12.5 mg Oral BID  . sodium chloride flush  3 mL Intravenous Once  . sodium chloride flush  3 mL Intravenous Q12H  . ticagrelor  90 mg Oral BID   Continuous Infusions: . sodium chloride     PRN Meds: sodium chloride, acetaminophen, amitriptyline, hydrALAZINE, morphine injection, nitroGLYCERIN, ondansetron (ZOFRAN) IV, sodium chloride flush   Vital Signs    Vitals:   12/15/19 0434 12/15/19 0500 12/15/19 0600 12/15/19 0736  BP:  115/69 107/60   Pulse:  87 77   Resp:  17 (!) 23   Temp: 98 F (36.7 C)   98.4 F (36.9 C)  TempSrc:    Oral  SpO2:  95% 94%   Weight:   (!) 139.7 kg   Height:        Intake/Output Summary (Last 24 hours) at 12/15/2019 0821 Last data filed at 12/14/2019 1800 Gross per 24 hour  Intake 1319.6 ml  Output 1650 ml  Net -330.4 ml   Last 3 Weights 12/15/2019 12/14/2019 12/13/2019  Weight (lbs) 307 lb 15.7 oz 328 lb 0.7 oz 329 lb 9.4 oz  Weight (kg) 139.7 kg 148.8 kg 149.5 kg      Telemetry    NSR - Personally Reviewed  ECG    NSR, inferior ST elevation - Personally Reviewed  Physical Exam  Morbidly obese. Healthy R radial access site GEN: No acute distress.   Neck: No JVD Cardiac: RRR, no murmurs, rubs, or gallops.  Respiratory: Clear to auscultation bilaterally. GI: Soft, nontender, non-distended  MS: No edema; No  deformity. Neuro:  Nonfocal  Psych: Normal affect   Labs    High Sensitivity Troponin:   Recent Labs  Lab 12/13/19 1903 12/13/19 2149 12/14/19 0454 12/14/19 0746 12/14/19 1900  TROPONINIHS 297* 513* 2,801* 4,507* 5,704*      Chemistry Recent Labs  Lab 12/13/19 1638 12/14/19 0454  NA 137 136  K 3.9 4.1  CL 98 98  CO2 26 24  GLUCOSE 240* 203*  BUN 25* 17  CREATININE 0.57 0.59  CALCIUM 9.2 8.7*  PROT 7.9  --   ALBUMIN 4.0  --   AST 31  --   ALT 35  --   ALKPHOS 132*  --   BILITOT 0.7  --   GFRNONAA >60 >60  GFRAA >60 >60  ANIONGAP 13 14     Hematology Recent Labs  Lab 12/13/19 1638 12/14/19 0454 12/15/19 0747  WBC 9.2 10.2 9.4  RBC 5.59* 5.31* 5.08  HGB 15.2* 14.4 14.0  HCT 46.7* 44.8 43.5  MCV 83.5 84.4 85.6  MCH 27.2 27.1 27.6  MCHC 32.5 32.1 32.2  RDW 13.4 13.6 13.9  PLT 256 285 250    BNPNo results for input(s): BNP, PROBNP in the last 168 hours.  DDimer  Recent Labs  Lab 12/13/19 1638  DDIMER 0.42     Radiology    DG Chest 2 View  Result Date: 12/13/2019 CLINICAL DATA:  Chest pain. EXAM: CHEST - 2 VIEW COMPARISON:  December 30, 2014. FINDINGS: The heart size and mediastinal contours are within normal limits. Both lungs are clear. No pneumothorax or pleural effusion is noted. The visualized skeletal structures are unremarkable. IMPRESSION: No active cardiopulmonary disease. Electronically Signed   By: Marijo Conception M.D.   On: 12/13/2019 16:59   CARDIAC CATHETERIZATION  Result Date: 12/14/2019  Prox RCA-1 lesion is 30% stenosed.  Prox RCA-2 lesion is 100% stenosed.  Mid Cx to Dist Cx lesion is 30% stenosed.  Dist LAD lesion is 30% stenosed.  A drug-eluting stent was successfully placed using a SYNERGY XD 3.0X16.  Post intervention, there is a 0% residual stenosis.  1. Inferior STEMI secondary to thrombotic occlusion of the mid RCA 2. Successful PTCA/DES x 1 mid RCA 3. Mild non-obstructive disease in the proximal RCA, distal  Circumflex and distal LAD 4. Elevated LVEDP Recommendations: Will continue Aggrastat drip for 12 hours given heavy thrombus burden within the stented segment. Continue ASA and Brilinta for one year. Echo later today. High intensity statin. Beta blocker as heart rate tolerates.   ECHOCARDIOGRAM COMPLETE  Result Date: 12/14/2019    ECHOCARDIOGRAM REPORT   Patient Name:   Kristin Reilly Date of Exam: 12/14/2019 Medical Rec #:  [id number]          Height:       66.5 in Accession #:    [id number]         Weight:       328.0 lb Date of Birth:  Aug 23, 1966           BSA:          2.48 m Patient Age:    54 years           BP:           143/75 mmHg Patient Gender: F                  HR:           83 bpm. Exam Location:  Inpatient Procedure: 2D Echo and Intracardiac Opacification Agent Indications:    STEMI I21.4  History:        Patient has no prior history of Echocardiogram examinations.                 Risk Factors:Diabetes and Dyslipidemia.  Sonographer:    Clayton Lefort RDCS (AE) Referring Phys: [place] Comments: Patient is morbidly obese. Image acquisition challenging due to patient body habitus. IMPRESSIONS  1. Overall EF preserved small area of inferior basal hypokinesis . Left ventricular ejection fraction, by estimation, is 55 to 60%. The left ventricle has normal function. The left ventricle demonstrates regional wall motion abnormalities (see scoring diagram/findings for description). There is severely increased left ventricular hypertrophy. Left ventricular diastolic parameters are consistent with Grade I diastolic dysfunction (impaired relaxation).  2. Right ventricular systolic function is moderately reduced. The right ventricular size is moderately enlarged.  3. The mitral valve is normal in structure and function. No evidence of mitral valve regurgitation. No evidence of mitral stenosis.  4. The aortic valve is tricuspid. Aortic valve regurgitation is not visualized. Mild to  moderate aortic valve sclerosis/calcification is present, without any evidence of aortic stenosis. FINDINGS  Left Ventricle: Overall  EF preserved small area of inferior basal hypokinesis. Left ventricular ejection fraction, by estimation, is 55 to 60%. The left ventricle has normal function. The left ventricle demonstrates regional wall motion abnormalities. Definity contrast agent was given IV to delineate the left ventricular endocardial borders. The left ventricular internal cavity size was normal in size. There is severely increased left ventricular hypertrophy. Left ventricular diastolic parameters are consistent with Grade I diastolic dysfunction (impaired relaxation). Right Ventricle: The right ventricular size is moderately enlarged. No increase in right ventricular wall thickness. Right ventricular systolic function is moderately reduced. Left Atrium: Left atrial size was normal in size. Right Atrium: Right atrial size was normal in size. Pericardium: There is no evidence of pericardial effusion. Mitral Valve: The mitral valve is normal in structure and function. There is mild thickening of the mitral valve leaflet(s). Normal mobility of the mitral valve leaflets. Mild mitral annular calcification. No evidence of mitral valve regurgitation. No evidence of mitral valve stenosis. Tricuspid Valve: The tricuspid valve is normal in structure. Tricuspid valve regurgitation is trivial. No evidence of tricuspid stenosis. Aortic Valve: The aortic valve is tricuspid. Aortic valve regurgitation is not visualized. Mild to moderate aortic valve sclerosis/calcification is present, without any evidence of aortic stenosis. Aortic valve mean gradient measures 4.0 mmHg. Aortic valve peak gradient measures 8.2 mmHg. Aortic valve area, by VTI measures 2.35 cm. Pulmonic Valve: The pulmonic valve was normal in structure. Pulmonic valve regurgitation is trivial. No evidence of pulmonic stenosis. Aorta: The aortic root is normal  in size and structure. IAS/Shunts: The interatrial septum was not well visualized.  LEFT VENTRICLE PLAX 2D LVIDd:         3.51 cm  Diastology LVIDs:         2.29 cm  LV e' lateral:   4.79 cm/s LV PW:         1.65 cm  LV E/e' lateral: 10.7 LV IVS:        1.87 cm  LV e' medial:    5.33 cm/s LVOT diam:     2.00 cm  LV E/e' medial:  9.6 LV SV:         59.69 ml LV SV Index:   12.19 LVOT Area:     3.14 cm  RIGHT VENTRICLE RV Basal diam:  2.80 cm RV S prime:     17.00 cm/s TAPSE (M-mode): 2.1 cm LEFT ATRIUM             Index       RIGHT ATRIUM           Index LA diam:        3.20 cm 1.29 cm/m  RA Area:     15.00 cm LA Vol (A2C):   30.8 ml 12.41 ml/m RA Volume:   34.40 ml  13.86 ml/m LA Vol (A4C):   38.5 ml 15.51 ml/m LA Biplane Vol: 38.0 ml 15.31 ml/m  AORTIC VALVE AV Area (Vmax):    2.28 cm AV Area (Vmean):   2.28 cm AV Area (VTI):     2.35 cm AV Vmax:           143.00 cm/s AV Vmean:          95.400 cm/s AV VTI:            0.254 m AV Peak Grad:      8.2 mmHg AV Mean Grad:      4.0 mmHg LVOT Vmax:         104.00 cm/s  LVOT Vmean:        69.300 cm/s LVOT VTI:          0.190 m LVOT/AV VTI ratio: 0.75  AORTA Ao Root diam: 2.80 cm Ao Asc diam:  3.50 cm MITRAL VALVE MV Area (PHT): 3.23 cm    SHUNTS MV Decel Time: 235 msec    Systemic VTI:  0.19 m MV E velocity: 51.40 cm/s  Systemic Diam: 2.00 cm MV A velocity: 72.00 cm/s MV E/A ratio:  0.71 Jenkins Rouge MD Electronically signed by Jenkins Rouge MD Signature Date/Time: 12/14/2019/5:27:03 PM    Final     Cardiac Studies   Heart Cath 12/14/2019  Prox RCA-1 lesion is 30% stenosed.  Prox RCA-2 lesion is 100% stenosed.  Mid Cx to Dist Cx lesion is 30% stenosed.  Dist LAD lesion is 30% stenosed.  A drug-eluting stent was successfully placed using a SYNERGY XD 3.0X16.  Post intervention, there is a 0% residual stenosis.  1. Inferior STEMI secondary to thrombotic occlusion of the mid RCA 2. Successful PTCA/DES x 1 mid RCA 3. Mild non-obstructive disease in  the proximal RCA, distal Circumflex and distal LAD 4. Elevated LVEDP  Recommendations: Will continue Aggrastat drip for 12 hours given heavy thrombus burden within the stented segment. Continue ASA and Brilinta for one year. Echo later today. High intensity statin. Beta blocker as heart rate tolerates.    Diagnostic Dominance: Right  Intervention    12/14/2019 ECHO 1. Overall EF preserved small area of inferior basal hypokinesis . Left  ventricular ejection fraction, by estimation, is 55 to 60%. The left  ventricle has normal function. The left ventricle demonstrates regional  wall motion abnormalities (see scoring  diagram/findings for description). There is severely increased left  ventricular hypertrophy. Left ventricular diastolic parameters are  consistent with Grade I diastolic dysfunction (impaired relaxation).  2. Right ventricular systolic function is moderately reduced. The right  ventricular size is moderately enlarged.  3. The mitral valve is normal in structure and function. No evidence of  mitral valve regurgitation. No evidence of mitral stenosis.  4. The aortic valve is tricuspid. Aortic valve regurgitation is not  visualized. Mild to moderate aortic valve sclerosis/calcification is  present, without any evidence of aortic stenosis.   Patient Profile     54 y.o. female with morbid obesity,DMII, HLD,HTN, day #1 s/p inferior STEMI with PCI-DES to mid RCA, large thrombus burden, preserved LVEF.  Assessment & Plan    1. Single vessel CAD w inferior STEMI, s/p DES-RCA: large thrombus burden so had extended aggrastat infusion. Now asymptomatic. Transfer to telemetry, begin ambulation, probable DC tomorrow, cardiac rehab. DAPT x 12 months, high dose statin, beta blocker. 2. HTN:  On ARB and beta blocker. Thiazide stopped. 3. DM: resume metformin tomorrow. Target A1c < 7%.     For questions or updates, please contact [place] Please consult  [email or web address] for contact info under        Signed, Sanda Klein, MD  12/15/2019, 8:21 AM

## 2019-12-16 LAB — GLUCOSE, CAPILLARY
Glucose-Capillary: 189 mg/dL — ABNORMAL HIGH (ref 70–99)
Glucose-Capillary: 140 mg/dL — ABNORMAL HIGH (ref 70–99)

## 2019-12-16 MED ORDER — ATORVASTATIN CALCIUM 80 MG PO TABS: 80.0000 mg | ORAL_TABLET | Freq: Every day | ORAL | 11 refills | Status: AC

## 2019-12-16 MED ORDER — TICAGRELOR 90 MG PO TABS: 90.0000 mg | ORAL_TABLET | Freq: Two times a day (BID) | ORAL | 0 refills | Status: DC

## 2019-12-16 MED ORDER — METOPROLOL TARTRATE 25 MG PO TABS: 25.0000 mg | ORAL_TABLET | Freq: Two times a day (BID) | ORAL | 11 refills | Status: AC

## 2019-12-16 MED ORDER — METOPROLOL TARTRATE 25 MG PO TABS
25.0000 mg | ORAL_TABLET | Freq: Two times a day (BID) | ORAL | Status: DC
  Administered 2019-12-16: 25 mg via ORAL
  Filled 2019-12-16: qty 1

## 2019-12-16 MED ORDER — ASPIRIN 81 MG PO CHEW: 81.0000 mg | CHEWABLE_TABLET | Freq: Every day | ORAL | Status: AC

## 2019-12-16 MED ORDER — TICAGRELOR 90 MG PO TABS: 90.0000 mg | ORAL_TABLET | Freq: Two times a day (BID) | ORAL | Status: DC

## 2019-12-16 MED ORDER — TICAGRELOR 90 MG PO TABS: 90.0000 mg | ORAL_TABLET | Freq: Two times a day (BID) | ORAL | 11 refills | Status: DC

## 2019-12-16 MED ORDER — NITROGLYCERIN 0.4 MG SL SUBL: 0.4000 mg | SUBLINGUAL_TABLET | SUBLINGUAL | 12 refills | Status: DC | PRN

## 2019-12-16 NOTE — Care Management (Signed)
Patient given Brilinta card.  Copay with Medicaid should be <$4.  Patient will verify accessibility with pharmacy prior to d/c.

## 2019-12-16 NOTE — Progress Notes (Addendum)
Progress Note  Patient Name: Kristin Reilly Date of Encounter: 12/16/2019  Primary Cardiologist: Quay Burow, MD New  Subjective   Doing well, no chest pain or SOB, ready for d/c  Inpatient Medications    Scheduled Meds:  vitamin C  500 mg Oral Daily   aspirin  81 mg Oral Daily   atorvastatin  80 mg Oral q1800   celecoxib  200 mg Oral Daily   Chlorhexidine Gluconate Cloth  6 each Topical Daily   cholecalciferol  1,000 Units Oral Daily   insulin aspart  0-5 Units Subcutaneous QHS   insulin aspart  0-9 Units Subcutaneous TID WC   irbesartan  75 mg Oral Daily   metoprolol tartrate  12.5 mg Oral BID   sodium chloride flush  3 mL Intravenous Once   sodium chloride flush  3 mL Intravenous Q12H   ticagrelor  90 mg Oral BID   Continuous Infusions:  sodium chloride     PRN Meds: sodium chloride, acetaminophen, amitriptyline, hydrALAZINE, morphine injection, nitroGLYCERIN, ondansetron (ZOFRAN) IV, sodium chloride flush   Vital Signs    Vitals:   12/15/19 0900 12/15/19 1012 12/15/19 2110 12/16/19 0612  BP: 130/86 (!) 135/91 (!) 155/96 (!) 152/81  Pulse: 93 88 92 91  Resp: 11   14  Temp:  97.8 F (36.6 C) 97.8 F (36.6 C) 97.8 F (36.6 C)  TempSrc:  Oral Oral Oral  SpO2: 96% 99% 98% 95%  Weight:    (!) 149.5 kg  Height:        Intake/Output Summary (Last 24 hours) at 12/16/2019 [id number] Last data filed at 12/15/2019 2145 Gross per 24 hour  Intake 480 ml  Output --  Net 480 ml   Last 3 Weights 12/16/2019 12/15/2019 12/14/2019  Weight (lbs) 329 lb 8 oz 307 lb 15.7 oz 328 lb 0.7 oz  Weight (kg) 149.46 kg 139.7 kg 148.8 kg      Telemetry    SR - Personally Reviewed  ECG    02/12 NSR, inferior ST elevation - Personally Reviewed  Physical Exam   General: Well developed, well nourished, female in no acute distress Head: Eyes PERRLA, Head normocephalic and atraumatic Lungs: clear bilaterally to auscultation. Heart: HRRR S1 S2, without rub or gallop. No murmur.  Upper extremity pulses are 2+ & equal. No JVD. Abdomen: Bowel sounds are present, abdomen soft and non-tender without masses or  hernias noted. Msk: Normal strength and tone for age. Extremities: No clubbing, cyanosis or edema.    Skin:  No rashes or lesions noted. Neuro: Alert and oriented X 3. Psych:  Good affect, responds appropriately   Labs    High Sensitivity Troponin:   Recent Labs  Lab 12/13/19 1903 12/13/19 2149 12/14/19 0454 12/14/19 0746 12/14/19 1900  TROPONINIHS 297* 513* 2,801* 4,507* 5,704*      Chemistry Recent Labs  Lab 12/13/19 1638 12/14/19 0454 12/15/19 0747  NA 137 136 136  K 3.9 4.1 4.0  CL 98 98 100  CO2 26 24 23   GLUCOSE 240* 203* 249*  BUN 25* 17 21*  CREATININE 0.57 0.59 0.68  CALCIUM 9.2 8.7* 8.7*  PROT 7.9  --   --   ALBUMIN 4.0  --   --   AST 31  --   --   ALT 35  --   --   ALKPHOS 132*  --   --   BILITOT 0.7  --   --   GFRNONAA >60 >60 >60  GFRAA >60 >  60 >60  ANIONGAP 13 14 13      Hematology Recent Labs  Lab 12/13/19 1638 12/14/19 0454 12/15/19 0747  WBC 9.2 10.2 9.4  RBC 5.59* 5.31* 5.08  HGB 15.2* 14.4 14.0  HCT 46.7* 44.8 43.5  MCV 83.5 84.4 85.6  MCH 27.2 27.1 27.6  MCHC 32.5 32.1 32.2  RDW 13.4 13.6 13.9  PLT 256 285 250    BNPNo results for input(s): BNP, PROBNP in the last 168 hours.   DDimer  Recent Labs  Lab 12/13/19 1638  DDIMER 0.42    Lab Results  Component Value Date   HGBA1C 7.5 (H) 12/13/2019   Lab Results  Component Value Date   CHOL 203 (H) 12/13/2019   HDL 46 12/13/2019   LDLCALC 120 (H) 12/13/2019   TRIG 186 (H) 12/13/2019   CHOLHDL 4.4 12/13/2019     Radiology    ECHOCARDIOGRAM COMPLETE  Result Date: 12/14/2019    ECHOCARDIOGRAM REPORT   Patient Name:   Kristin Reilly Date of Exam: 12/14/2019 Medical Rec #:  [id number]          Height:       66.5 in Accession #:    [id number]         Weight:       328.0 lb Date of Birth:  07-03-1966           BSA:          2.48 m Patient Age:     54 years           BP:           143/75 mmHg Patient Gender: F                  HR:           83 bpm. Exam Location:  Inpatient Procedure: 2D Echo and Intracardiac Opacification Agent Indications:    STEMI I21.4  History:        Patient has no prior history of Echocardiogram examinations.                 Risk Factors:Diabetes and Dyslipidemia.  Sonographer:    Clayton Lefort RDCS (AE) Referring Phys: [place] Comments: Patient is morbidly obese. Image acquisition challenging due to patient body habitus. IMPRESSIONS  1. Overall EF preserved small area of inferior basal hypokinesis . Left ventricular ejection fraction, by estimation, is 55 to 60%. The left ventricle has normal function. The left ventricle demonstrates regional wall motion abnormalities (see scoring diagram/findings for description). There is severely increased left ventricular hypertrophy. Left ventricular diastolic parameters are consistent with Grade I diastolic dysfunction (impaired relaxation).  2. Right ventricular systolic function is moderately reduced. The right ventricular size is moderately enlarged.  3. The mitral valve is normal in structure and function. No evidence of mitral valve regurgitation. No evidence of mitral stenosis.  4. The aortic valve is tricuspid. Aortic valve regurgitation is not visualized. Mild to moderate aortic valve sclerosis/calcification is present, without any evidence of aortic stenosis. FINDINGS  Left Ventricle: Overall EF preserved small area of inferior basal hypokinesis. Left ventricular ejection fraction, by estimation, is 55 to 60%. The left ventricle has normal function. The left ventricle demonstrates regional wall motion abnormalities. Definity contrast agent was given IV to delineate the left ventricular endocardial borders. The left ventricular internal cavity size was normal in size. There is severely increased left ventricular hypertrophy. Left ventricular diastolic  parameters are consistent with  Grade I diastolic dysfunction (impaired relaxation). Right Ventricle: The right ventricular size is moderately enlarged. No increase in right ventricular wall thickness. Right ventricular systolic function is moderately reduced. Left Atrium: Left atrial size was normal in size. Right Atrium: Right atrial size was normal in size. Pericardium: There is no evidence of pericardial effusion. Mitral Valve: The mitral valve is normal in structure and function. There is mild thickening of the mitral valve leaflet(s). Normal mobility of the mitral valve leaflets. Mild mitral annular calcification. No evidence of mitral valve regurgitation. No evidence of mitral valve stenosis. Tricuspid Valve: The tricuspid valve is normal in structure. Tricuspid valve regurgitation is trivial. No evidence of tricuspid stenosis. Aortic Valve: The aortic valve is tricuspid. Aortic valve regurgitation is not visualized. Mild to moderate aortic valve sclerosis/calcification is present, without any evidence of aortic stenosis. Aortic valve mean gradient measures 4.0 mmHg. Aortic valve peak gradient measures 8.2 mmHg. Aortic valve area, by VTI measures 2.35 cm. Pulmonic Valve: The pulmonic valve was normal in structure. Pulmonic valve regurgitation is trivial. No evidence of pulmonic stenosis. Aorta: The aortic root is normal in size and structure. IAS/Shunts: The interatrial septum was not well visualized.  LEFT VENTRICLE PLAX 2D LVIDd:         3.51 cm  Diastology LVIDs:         2.29 cm  LV e' lateral:   4.79 cm/s LV PW:         1.65 cm  LV E/e' lateral: 10.7 LV IVS:        1.87 cm  LV e' medial:    5.33 cm/s LVOT diam:     2.00 cm  LV E/e' medial:  9.6 LV SV:         59.69 ml LV SV Index:   12.19 LVOT Area:     3.14 cm  RIGHT VENTRICLE RV Basal diam:  2.80 cm RV S prime:     17.00 cm/s TAPSE (M-mode): 2.1 cm LEFT ATRIUM             Index       RIGHT ATRIUM           Index LA diam:        3.20 cm 1.29 cm/m  RA  Area:     15.00 cm LA Vol (A2C):   30.8 ml 12.41 ml/m RA Volume:   34.40 ml  13.86 ml/m LA Vol (A4C):   38.5 ml 15.51 ml/m LA Biplane Vol: 38.0 ml 15.31 ml/m  AORTIC VALVE AV Area (Vmax):    2.28 cm AV Area (Vmean):   2.28 cm AV Area (VTI):     2.35 cm AV Vmax:           143.00 cm/s AV Vmean:          95.400 cm/s AV VTI:            0.254 m AV Peak Grad:      8.2 mmHg AV Mean Grad:      4.0 mmHg LVOT Vmax:         104.00 cm/s LVOT Vmean:        69.300 cm/s LVOT VTI:          0.190 m LVOT/AV VTI ratio: 0.75  AORTA Ao Root diam: 2.80 cm Ao Asc diam:  3.50 cm MITRAL VALVE MV Area (PHT): 3.23 cm    SHUNTS MV Decel Time: 235 msec    Systemic VTI:  0.19 m MV E velocity: 51.40  cm/s  Systemic Diam: 2.00 cm MV A velocity: 72.00 cm/s MV E/A ratio:  0.71 Jenkins Rouge MD Electronically signed by Jenkins Rouge MD Signature Date/Time: 12/14/2019/5:27:03 PM    Final     Cardiac Studies   Heart Cath 12/14/2019 Prox RCA-1 lesion is 30% stenosed. Prox RCA-2 lesion is 100% stenosed. Mid Cx to Dist Cx lesion is 30% stenosed. Dist LAD lesion is 30% stenosed. A drug-eluting stent was successfully placed using a SYNERGY XD 3.0X16. Post intervention, there is a 0% residual stenosis.   1. Inferior STEMI secondary to thrombotic occlusion of the mid RCA 2. Successful PTCA/DES x 1 mid RCA 3. Mild non-obstructive disease in the proximal RCA, distal Circumflex and distal LAD 4. Elevated LVEDP   Recommendations: Will continue Aggrastat drip for 12 hours given heavy thrombus burden within the stented segment. Continue ASA and Brilinta for one year. Echo later today. High intensity statin. Beta blocker as heart rate tolerates.      Diagnostic Dominance: Right  Intervention      12/14/2019 ECHO  1. Overall EF preserved small area of inferior basal hypokinesis . Left  ventricular ejection fraction, by estimation, is 55 to 60%. The left  ventricle has normal function. The left ventricle demonstrates regional  wall  motion abnormalities (see scoring  diagram/findings for description). There is severely increased left  ventricular hypertrophy. Left ventricular diastolic parameters are  consistent with Grade I diastolic dysfunction (impaired relaxation).   2. Right ventricular systolic function is moderately reduced. The right  ventricular size is moderately enlarged.   3. The mitral valve is normal in structure and function. No evidence of  mitral valve regurgitation. No evidence of mitral stenosis.   4. The aortic valve is tricuspid. Aortic valve regurgitation is not  visualized. Mild to moderate aortic valve sclerosis/calcification is  present, without any evidence of aortic stenosis.   Patient Profile     54 y.o. female with morbid obesity, DMII, HLD,HTN, day #1 s/p inferior STEMI with PCI-DES to mid RCA, large thrombus burden, preserved LVEF.  Assessment & Plan    1. Single vessel CAD w inferior STEMI, s/p DES-RCA:  - lg thrombus>>aggrastat x 12 h - seen by cardiac rehab - on ASA, Brilinta, Lipitor 80, ARB, Lopressor 12.5 mg bid - rx nitro at d/c . 2. HTN:   - BB added, increase dose since BP up and HR not low - on home ARB dose - pta on HCTZ 25 mg, discuss restarting this with MD - per pt, was told she was not to restart the HCTZ - Pt rarely checked BP pta, but it was generally good when checked  3. DM:  - d/c on home rx, diet compliance encouraged    Plan: d/c today  For questions or updates, please contact [place] Please consult [email or web address] for contact info under        Signed, Rosaria Ferries, PA-C  12/16/2019, 7:42 AM    Patient examined chart reviewed. Ambulated well No angina Discussed importance of DAT with thrombotic occlusion We will try to get her a 30 day Brillinta card. Exam with obesity, cath site well healed no murmur clear lungs  Will arrange outpatient f/u with Dr Shiela Mayer MD [facility]

## 2019-12-16 NOTE — Discharge Summary (Signed)
Discharge Summary    Patient ID: Kristin Reilly MRN: [id number]; DOB: 27-Jan-1966  Admit date: 12/13/2019 Discharge date: 12/16/2019  Primary Care Provider: Berkley Harvey, NP  Primary Cardiologist: Quay Burow, MD  Primary Electrophysiologist:  None   Discharge Diagnoses    Principal Problem:   NSTEMI (non-ST elevated myocardial infarction) [facility]) Active Problems:   Essential (primary) hypertension   Type 2 diabetes mellitus without complication, without long-term current use of insulin ([place])   HLD (hyperlipidemia)   Acute ST elevation myocardial infarction (STEMI) of inferior wall (HCC)   Allergies Allergies  Allergen Reactions  . Fenofibrate Nausea And Vomiting  . Tylox [Oxycodone-Acetaminophen] Other (See Comments)    Migraine HA and GI upset    Diagnostic Studies/Procedures    Heart Cath 01-09-2020  Prox RCA-1 lesion is 30% stenosed.  Prox RCA-2 lesion is 100% stenosed.  Mid Cx to Dist Cx lesion is 30% stenosed.  Dist LAD lesion is 30% stenosed.  A drug-eluting stent was successfully placed using a SYNERGY XD 3.0X16.  Post intervention, there is a 0% residual stenosis.  1. Inferior STEMI secondary to thrombotic occlusion of the mid RCA 2. Successful PTCA/DES x 1 mid RCA 3. Mild non-obstructive disease in the proximal RCA, distal Circumflex and distal LAD 4. Elevated LVEDP  Recommendations: Will continue Aggrastat drip for 12 hours given heavy thrombus burden within the stented segment. Continue ASA and Brilinta for one year. Echo later today. High intensity statin. Beta blocker as heart rate tolerates.    Diagnostic Dominance: Right  Intervention    09-Jan-2020 ECHO 1. Overall EF preserved small area of inferior basal hypokinesis . Left  ventricular ejection fraction, by estimation, is 55 to 60%. The left  ventricle has normal function. The left ventricle demonstrates regional  wall motion abnormalities (see scoring  diagram/findings for  description). There is severely increased left  ventricular hypertrophy. Left ventricular diastolic parameters are  consistent with Grade I diastolic dysfunction (impaired relaxation).  2. Right ventricular systolic function is moderately reduced. The right  ventricular size is moderately enlarged.  3. The mitral valve is normal in structure and function. No evidence of  mitral valve regurgitation. No evidence of mitral stenosis.  4. The aortic valve is tricuspid. Aortic valve regurgitation is not  visualized. Mild to moderate aortic valve sclerosis/calcification is  present, without any evidence of aortic stenosis.  _____________   History of Present Illness     Kristin Reilly is a 54 y.o. female with morbid obesity,DMII, HLD,HTN, was admitted 12/13/2019 with chest pain. She initially went to [facility] with chest pain.  Her initial ECG did not show any ST elevation, but repeat ECGs showed ST elevation in posterior leads.  She was having ongoing pain. She was transferred to [facility] for STEMI.  Hospital Course     Consultants: None  She was taken emergently to the Cath Lab, results are above.  She had a DES to the RCA. There was a large thrombus burden, so she was on Aggrastat for 12 hours. Medical therapy for other nonobstructive disease.  An echo was performed, her EF is preserved.  She was seen by cardiac rehab and educated on heart healthy eating, exercise guidelines and the importance of medication compliance.  She is on aspirin, Brilinta, high-dose statin, beta-blocker and ARB.  On 2/14, she was seen by Dr Johnsie Cancel and all data were reviewed.  She was ambulating without chest pain or shortness of breath.  She is off her  HCTZ for now, she is encouraged to get a blood pressure cuff and follow her blood pressure as an outpatient.  No further inpatient work-up is indicated and she is considered stable for discharge, to follow-up as an outpatient.  Did the  patient have an acute coronary syndrome (MI, NSTEMI, STEMI, etc) this admission?:  Yes                               AHA/ACC Clinical Performance & Quality Measures: 1. Aspirin prescribed? - Yes 2. ADP Receptor Inhibitor (Plavix/Clopidogrel, Brilinta/Ticagrelor or Effient/Prasugrel) prescribed (includes medically managed patients)? - Yes 3. Beta Blocker prescribed? - Yes 4. High Intensity Statin (Lipitor 40-80mg  or Crestor 20-40mg ) prescribed? - Yes 5. EF assessed during THIS hospitalization? - Yes 6. For EF <40%, was ACEI/ARB prescribed? - Yes 7. For EF <40%, Aldosterone Antagonist (Spironolactone or Eplerenone) prescribed? - Yes 8. Cardiac Rehab Phase II ordered (Included Medically managed Patients)? - Yes   _____________  Discharge Vitals Blood pressure 137/83, pulse 91, temperature 97.8 F (36.6 C), temperature source Oral, resp. rate 14, height 5' 6.5" (1.689 m), weight (!) 149.5 kg, SpO2 95 %.  Filed Weights   12/14/19 0245 12/15/19 0600 12/16/19 0612  Weight: (!) 148.8 kg (!) 139.7 kg (!) 149.5 kg    Labs & Radiologic Studies    CBC Recent Labs    12/14/19 0454 12/15/19 0747  WBC 10.2 9.4  HGB 14.4 14.0  HCT 44.8 43.5  MCV 84.4 85.6  PLT 285 [id number]   Basic Metabolic Panel Recent Labs    12/14/19 0454 12/15/19 0747  NA 136 136  K 4.1 4.0  CL 98 100  CO2 24 23  GLUCOSE 203* 249*  BUN 17 21*  CREATININE 0.59 0.68  CALCIUM 8.7* 8.7*   Liver Function Tests Recent Labs    12/13/19 1638  AST 31  ALT 35  ALKPHOS 132*  BILITOT 0.7  PROT 7.9  ALBUMIN 4.0   No results for input(s): LIPASE, AMYLASE in the last 72 hours. High Sensitivity Troponin:   Recent Labs  Lab 12/13/19 1903 12/13/19 2149 12/14/19 0454 12/14/19 0746 12/14/19 1900  TROPONINIHS 297* 513* 2,801* 4,507* 5,704*    BNP Invalid input(s): POCBNP D-Dimer Recent Labs    12/13/19 1638  DDIMER 0.42   Hemoglobin A1C Recent Labs    12/13/19 2149  HGBA1C 7.5*   Fasting Lipid  Panel Recent Labs    12/13/19 2149  CHOL 203*  HDL 46  LDLCALC 120*  TRIG 186*  CHOLHDL 4.4   Thyroid Function Tests No results for input(s): TSH, T4TOTAL, T3FREE, THYROIDAB in the last 72 hours.  Invalid input(s): FREET3 _____________  DG Chest 2 View  Result Date: 12/13/2019 CLINICAL DATA:  Chest pain. EXAM: CHEST - 2 VIEW COMPARISON:  December 30, 2014. FINDINGS: The heart size and mediastinal contours are within normal limits. Both lungs are clear. No pneumothorax or pleural effusion is noted. The visualized skeletal structures are unremarkable. IMPRESSION: No active cardiopulmonary disease. Electronically Signed   By: Marijo Conception M.D.   On: 12/13/2019 16:59   CARDIAC CATHETERIZATION  Result Date: 12/14/2019  Prox RCA-1 lesion is 30% stenosed.  Prox RCA-2 lesion is 100% stenosed.  Mid Cx to Dist Cx lesion is 30% stenosed.  Dist LAD lesion is 30% stenosed.  A drug-eluting stent was successfully placed using a SYNERGY XD 3.0X16.  Post intervention, there is a 0% residual stenosis.  1.  Inferior STEMI secondary to thrombotic occlusion of the mid RCA 2. Successful PTCA/DES x 1 mid RCA 3. Mild non-obstructive disease in the proximal RCA, distal Circumflex and distal LAD 4. Elevated LVEDP Recommendations: Will continue Aggrastat drip for 12 hours given heavy thrombus burden within the stented segment. Continue ASA and Brilinta for one year. Echo later today. High intensity statin. Beta blocker as heart rate tolerates.   ECHOCARDIOGRAM COMPLETE  Result Date: 12/14/2019    ECHOCARDIOGRAM REPORT   Patient Name:   Kristin Reilly Date of Exam: 12/14/2019 Medical Rec #:  [id number]          Height:       66.5 in Accession #:    [id number]         Weight:       328.0 lb Date of Birth:  July 25, 1966           BSA:          2.48 m Patient Age:    25 years           BP:           143/75 mmHg Patient Gender: F                  HR:           83 bpm. Exam Location:  Inpatient Procedure: 2D Echo and  Intracardiac Opacification Agent Indications:    STEMI I21.4  History:        Patient has no prior history of Echocardiogram examinations.                 Risk Factors:Diabetes and Dyslipidemia.  Sonographer:    Clayton Lefort RDCS (AE) Referring Phys: Belle Comments: Patient is morbidly obese. Image acquisition challenging due to patient body habitus. IMPRESSIONS  1. Overall EF preserved small area of inferior basal hypokinesis . Left ventricular ejection fraction, by estimation, is 55 to 60%. The left ventricle has normal function. The left ventricle demonstrates regional wall motion abnormalities (see scoring diagram/findings for description). There is severely increased left ventricular hypertrophy. Left ventricular diastolic parameters are consistent with Grade I diastolic dysfunction (impaired relaxation).  2. Right ventricular systolic function is moderately reduced. The right ventricular size is moderately enlarged.  3. The mitral valve is normal in structure and function. No evidence of mitral valve regurgitation. No evidence of mitral stenosis.  4. The aortic valve is tricuspid. Aortic valve regurgitation is not visualized. Mild to moderate aortic valve sclerosis/calcification is present, without any evidence of aortic stenosis. FINDINGS  Left Ventricle: Overall EF preserved small area of inferior basal hypokinesis. Left ventricular ejection fraction, by estimation, is 55 to 60%. The left ventricle has normal function. The left ventricle demonstrates regional wall motion abnormalities. Definity contrast agent was given IV to delineate the left ventricular endocardial borders. The left ventricular internal cavity size was normal in size. There is severely increased left ventricular hypertrophy. Left ventricular diastolic parameters are consistent with Grade I diastolic dysfunction (impaired relaxation). Right Ventricle: The right ventricular size is moderately enlarged. No  increase in right ventricular wall thickness. Right ventricular systolic function is moderately reduced. Left Atrium: Left atrial size was normal in size. Right Atrium: Right atrial size was normal in size. Pericardium: There is no evidence of pericardial effusion. Mitral Valve: The mitral valve is normal in structure and function. There is mild thickening of the mitral valve leaflet(s). Normal mobility of the mitral valve leaflets. Mild  mitral annular calcification. No evidence of mitral valve regurgitation. No evidence of mitral valve stenosis. Tricuspid Valve: The tricuspid valve is normal in structure. Tricuspid valve regurgitation is trivial. No evidence of tricuspid stenosis. Aortic Valve: The aortic valve is tricuspid. Aortic valve regurgitation is not visualized. Mild to moderate aortic valve sclerosis/calcification is present, without any evidence of aortic stenosis. Aortic valve mean gradient measures 4.0 mmHg. Aortic valve peak gradient measures 8.2 mmHg. Aortic valve area, by VTI measures 2.35 cm. Pulmonic Valve: The pulmonic valve was normal in structure. Pulmonic valve regurgitation is trivial. No evidence of pulmonic stenosis. Aorta: The aortic root is normal in size and structure. IAS/Shunts: The interatrial septum was not well visualized.  LEFT VENTRICLE PLAX 2D LVIDd:         3.51 cm  Diastology LVIDs:         2.29 cm  LV e' lateral:   4.79 cm/s LV PW:         1.65 cm  LV E/e' lateral: 10.7 LV IVS:        1.87 cm  LV e' medial:    5.33 cm/s LVOT diam:     2.00 cm  LV E/e' medial:  9.6 LV SV:         59.69 ml LV SV Index:   12.19 LVOT Area:     3.14 cm  RIGHT VENTRICLE RV Basal diam:  2.80 cm RV S prime:     17.00 cm/s TAPSE (M-mode): 2.1 cm LEFT ATRIUM             Index       RIGHT ATRIUM           Index LA diam:        3.20 cm 1.29 cm/m  RA Area:     15.00 cm LA Vol (A2C):   30.8 ml 12.41 ml/m RA Volume:   34.40 ml  13.86 ml/m LA Vol (A4C):   38.5 ml 15.51 ml/m LA Biplane Vol: 38.0 ml 15.31  ml/m  AORTIC VALVE AV Area (Vmax):    2.28 cm AV Area (Vmean):   2.28 cm AV Area (VTI):     2.35 cm AV Vmax:           143.00 cm/s AV Vmean:          95.400 cm/s AV VTI:            0.254 m AV Peak Grad:      8.2 mmHg AV Mean Grad:      4.0 mmHg LVOT Vmax:         104.00 cm/s LVOT Vmean:        69.300 cm/s LVOT VTI:          0.190 m LVOT/AV VTI ratio: 0.75  AORTA Ao Root diam: 2.80 cm Ao Asc diam:  3.50 cm MITRAL VALVE MV Area (PHT): 3.23 cm    SHUNTS MV Decel Time: 235 msec    Systemic VTI:  0.19 m MV E velocity: 51.40 cm/s  Systemic Diam: 2.00 cm MV A velocity: 72.00 cm/s MV E/A ratio:  0.71 Jenkins Rouge MD Electronically signed by Jenkins Rouge MD Signature Date/Time: 12/14/2019/5:27:03 PM    Final    Disposition   Pt is being discharged home today in good condition.  Follow-up Plans & Appointments    Follow-up Information    Lorretta Harp, MD Follow up.   Specialties: Cardiology, Radiology Why: The office will call Contact information: [place]  [address] [address] [phone]          Discharge Instructions    Amb Referral to Cardiac Rehabilitation   Complete by: As directed    Diagnosis:  Coronary Stents STEMI PTCA     After initial evaluation and assessments completed: Virtual Based Care may be provided alone or in conjunction with Phase 2 Cardiac Rehab based on patient barriers.: Yes      Discharge Medications   Allergies as of 12/16/2019      Reactions   Fenofibrate Nausea And Vomiting   Tylox [oxycodone-acetaminophen] Other (See Comments)   Migraine HA and GI upset      Medication List    STOP taking these medications   hydrochlorothiazide 25 MG tablet Commonly known as: HYDRODIURIL   pravastatin 10 MG tablet Commonly known as: PRAVACHOL     TAKE these medications   Accu-Chek Softclix Lancets lancets 1 each by Misc.(Non-Drug; Combo Route) route daily.   amitriptyline 25 MG tablet Commonly known as: ELAVIL Take 25 mg by  mouth at bedtime as needed for sleep.   aspirin 81 MG chewable tablet Chew 1 tablet (81 mg total) by mouth daily. Start taking on: December 17, 2019   atorvastatin 80 MG tablet Commonly known as: LIPITOR Take 1 tablet (80 mg total) by mouth daily at 6 PM.   celecoxib 200 MG capsule Commonly known as: CELEBREX Take 200 mg by mouth daily.   cholecalciferol 25 MCG (1000 UNIT) tablet Commonly known as: VITAMIN D3 Take 1,000 Units by mouth daily.   GlucoCom Blood Glucose Monitor Devi 1 each by Misc.(Non-Drug; Combo Route) route daily. Include strips. Lancets, lancet device, control solution. batteries   glucose blood test strip 1 each by Misc.(Non-Drug; Combo Route) route daily.   metFORMIN 500 MG 24 hr tablet Commonly known as: GLUCOPHAGE-XR Take 500 mg by mouth 2 (two) times daily with a meal.   metoprolol tartrate 25 MG tablet Commonly known as: LOPRESSOR Take 1 tablet (25 mg total) by mouth 2 (two) times daily.   nitroGLYCERIN 0.4 MG SL tablet Commonly known as: NITROSTAT Place 1 tablet (0.4 mg total) under the tongue every 5 (five) minutes x 3 doses as needed for chest pain.   NON FORMULARY Place 1 drop into both eyes daily.   nystatin powder Commonly known as: MYCOSTATIN/NYSTOP Apply 1 Bottle topically daily as needed (irritation).   ticagrelor 90 MG Tabs tablet Commonly known as: BRILINTA Take 1 tablet (90 mg total) by mouth 2 (two) times daily.   valsartan 80 MG tablet Commonly known as: DIOVAN Take 80 mg by mouth daily.   vitamin C 500 MG tablet Commonly known as: ASCORBIC ACID Take 500 mg by mouth daily.          Outstanding Labs/Studies   None  Duration of Discharge Encounter   Greater than 30 minutes including physician time.  Signed, Rosaria Ferries, PA-C 12/16/2019, 11:05 AM

## 2019-12-17 ENCOUNTER — Encounter (HOSPITAL_COMMUNITY): Payer: Self-pay | Admitting: *Deleted

## 2019-12-17 NOTE — Progress Notes (Signed)
Referral received and verified for MD signature.  Follow up appt is 2/23. Insurance benefits and eligibility to be determined by support staff. Medicaid reimbursement form sent to MD for evaluation and determination.  Pt will be contacted upon satisfactorily completing her follow up appt on 12/25/19. Cherre Huger, BSN Cardiac and Training and development officer

## 2019-12-24 ENCOUNTER — Telehealth (HOSPITAL_COMMUNITY): Payer: Self-pay

## 2019-12-24 NOTE — Progress Notes (Signed)
Cardiology Clinic Note   Patient Name: Kristin Reilly Date of Encounter: 12/25/2019  Primary Care Provider:  Berkley Harvey, NP Primary Cardiologist:  Quay Burow, MD  Patient Profile    Kristin Reilly 54 year old female presents today for follow-up evaluation status post and STEMI.  Past Medical History    Past Medical History:  Diagnosis Date  . Arthritis   . Depression   . Glaucoma   . Hyperlipidemia   . Hypertension   . Sleep apnea    no cpap   Past Surgical History:  Procedure Laterality Date  . APPENDECTOMY    . COLONOSCOPY N/A 08/05/2015   Procedure: COLONOSCOPY;  Surgeon: Jerene Bears, MD;  Location: [facility];  Service: Gastroenterology;  Laterality: N/A;  . COLONOSCOPY WITH PROPOFOL N/A 01/09/2019   Procedure: COLONOSCOPY WITH PROPOFOL;  Surgeon: Jerene Bears, MD;  Location: [facility];  Service: Gastroenterology;  Laterality: N/A;  . CORONARY STENT INTERVENTION N/A 12/14/2019   Procedure: CORONARY STENT INTERVENTION;  Surgeon: Burnell Blanks, MD;  Location: [place] [facility];  Service: Cardiovascular;  Laterality: N/A;  . CORONARY/GRAFT ACUTE MI REVASCULARIZATION N/A 12/14/2019   Procedure: Coronary/Graft Acute MI Revascularization;  Surgeon: Burnell Blanks, MD;  Location: [place] [facility];  Service: Cardiovascular;  Laterality: N/A;  . LEFT HEART CATH AND CORONARY ANGIOGRAPHY N/A 12/14/2019   Procedure: LEFT HEART CATH AND CORONARY ANGIOGRAPHY;  Surgeon: Burnell Blanks, MD;  Location: [place] [facility];  Service: Cardiovascular;  Laterality: N/A;  . POLYPECTOMY  01/09/2019   Procedure: POLYPECTOMY;  Surgeon: Jerene Bears, MD;  Location: [facility];  Service: Gastroenterology;;  . TONSILLECTOMY    . WISDOM TOOTH EXTRACTION      Allergies  Allergies  Allergen Reactions  . Fenofibrate Nausea And Vomiting  . Tylox [Oxycodone-Acetaminophen] Other (See Comments)    Migraine HA and GI upset    History of Present  Illness  Kristin Reilly has a past medical history of diabetes mellitus type 2, hypertension, hyperlipidemia, NSTEMI status post cardiac catheterization with PCI DES x1, and preserved ejection fraction.  She presented to [facility] with chest pain on 12/13/2019.  Her initial EKG did not show ST elevation however, repeat EKG showed ST elevation in her posterior leads.  She had ongoing chest pain and was transferred to [facility] with a STEMI.  She received DES x1 to her proximal RCA.  Her catheterization also showed a 30% proximal RCA, mid to distal circumflex 30%, distal LAD 30%.  She was started on aspirin and Brilinta.  She presents to the clinic today for follow-up and states she is feeling much better since having her stent placed.  She does notice some dyspnea with more intense activities however with normal activities her breathing is normal.  She states that she had been having intermittent chest pain for almost a year and felt it was related to stress.  Since she has had her stent placed she has no recurrent episodes of chest pain.  She is compliant with her Brilinta medication and is trying to maintain a low-sodium diet.  She works as a Software engineer for a obese patient.  She feels she needs 1 more week to recover before returning to work.  I will continue her current medication therapy, give her the salty 6 handout, and encourage her to increase her physical activity to 15 minutes twice daily.  She will need to follow-up in 3 months with Dr. Gwenlyn Found.  Today  she denies chest pain, shortness of breath, lower extremity edema, palpitations, melena, hematuria, hemoptysis, diaphoresis, weakness, presyncope, syncope, orthopnea, and PND.    Home Medications    Prior to Admission medications   Medication Sig Start Date End Date Taking? Authorizing Provider  ACCU-CHEK SOFTCLIX LANCETS lancets 1 each by Misc.(Non-Drug; Combo Route) route daily. 09/06/18   [provider]  amitriptyline  (ELAVIL) 25 MG tablet Take 25 mg by mouth at bedtime as needed for sleep.  09/05/18   [provider]  aspirin 81 MG chewable tablet Chew 1 tablet (81 mg total) by mouth daily. 12/17/19   Barrett, Evelene Croon, PA-C  atorvastatin (LIPITOR) 80 MG tablet Take 1 tablet (80 mg total) by mouth daily at 6 PM. 12/16/19   Barrett, Evelene Croon, PA-C  Blood Glucose Monitoring Suppl (GLUCOCOM BLOOD GLUCOSE MONITOR) DEVI 1 each by Misc.(Non-Drug; Combo Route) route daily. Include strips. Lancets, lancet device, control solution. batteries 09/06/18   [provider]  celecoxib (CELEBREX) 200 MG capsule Take 200 mg by mouth daily.  05/24/17   [provider]  cholecalciferol (VITAMIN D3) 25 MCG (1000 UT) tablet Take 1,000 Units by mouth daily.    [provider]  glucose blood test strip 1 each by Misc.(Non-Drug; Combo Route) route daily. 09/06/18   [provider]  metFORMIN (GLUCOPHAGE-XR) 500 MG 24 hr tablet Take 500 mg by mouth 2 (two) times daily with a meal.  09/22/18   [provider]  metoprolol tartrate (LOPRESSOR) 25 MG tablet Take 1 tablet (25 mg total) by mouth 2 (two) times daily. 12/16/19   Barrett, Evelene Croon, PA-C  nitroGLYCERIN (NITROSTAT) 0.4 MG SL tablet Place 1 tablet (0.4 mg total) under the tongue every 5 (five) minutes x 3 doses as needed for chest pain. 12/16/19   Barrett, Evelene Croon, PA-C  NON FORMULARY Place 1 drop into both eyes daily.    [provider]  nystatin (MYCOSTATIN/NYSTOP) powder Apply 1 Bottle topically daily as needed (irritation).  06/05/18   [provider]  ticagrelor (BRILINTA) 90 MG TABS tablet Take 1 tablet (90 mg total) by mouth 2 (two) times daily. 12/16/19   Barrett, Evelene Croon, PA-C  valsartan (DIOVAN) 80 MG tablet Take 80 mg by mouth daily.  09/06/18   [provider]  vitamin C (ASCORBIC ACID) 500 MG tablet Take 500 mg by mouth daily.    [provider]    Family History    Family History    Problem Relation Age of Onset  . CVA Mother   . Cerebral aneurysm Mother   . Clotting disorder Mother   . Aneurysm Maternal Grandmother   . Mental illness Sister   . Alcoholism Brother   . Throat cancer Maternal Grandfather   . Bipolar disorder Sister   . ADD / ADHD Son   . Colon cancer Neg Hx   . Breast cancer Neg Hx   . Esophageal cancer Neg Hx   . Rectal cancer Neg Hx   . Stomach cancer Neg Hx    She indicated that her mother is deceased. She indicated that her father is deceased. She indicated that only one of her two sisters is alive. She indicated that only one of her two brothers is alive. She reported the following about her brother of unknown status: unknown. She indicated that her maternal grandmother is deceased. She indicated that her maternal grandfather is deceased. She indicated that her paternal grandmother is deceased. She indicated that her paternal grandfather  is deceased. She indicated that the status of her son is unknown. She indicated that the status of her neg hx is unknown.  Social History    Social History   Socioeconomic History  . Marital status: Divorced    Spouse name: Not on file  . Number of children: Not on file  . Years of education: Not on file  . Highest education level: Not on file  Occupational History  . Not on file  Tobacco Use  . Smoking status: Former Smoker    Quit date: 07/21/2005    Years since quitting: 14.4  . Smokeless tobacco: Never Used  . Tobacco comment: quit 2008  Substance and Sexual Activity  . Alcohol use: Yes    Alcohol/week: 0.0 standard drinks    Comment: occasionally  . Drug use: No  . Sexual activity: Yes    Birth control/protection: I.U.D.  Other Topics Concern  . Not on file  Social History Narrative  . Not on file   Social Determinants of Health   Financial Resource Strain:   . Difficulty of Paying Living Expenses: Not on file  Food Insecurity:   . Worried About Charity fundraiser in the Last Year:  Not on file  . Ran Out of Food in the Last Year: Not on file  Transportation Needs:   . Lack of Transportation (Medical): Not on file  . Lack of Transportation (Non-Medical): Not on file  Physical Activity:   . Days of Exercise per Week: Not on file  . Minutes of Exercise per Session: Not on file  Stress:   . Feeling of Stress : Not on file  Social Connections:   . Frequency of Communication with Friends and Family: Not on file  . Frequency of Social Gatherings with Friends and Family: Not on file  . Attends Religious Services: Not on file  . Active Member of Clubs or Organizations: Not on file  . Attends Archivist Meetings: Not on file  . Marital Status: Not on file  Intimate Partner Violence:   . Fear of Current or Ex-Partner: Not on file  . Emotionally Abused: Not on file  . Physically Abused: Not on file  . Sexually Abused: Not on file     Review of Systems    General:  No chills, fever, night sweats or weight changes.  Cardiovascular:  No chest pain, dyspnea on exertion, edema, orthopnea, palpitations, paroxysmal nocturnal dyspnea. Dermatological: No rash, lesions/masses Respiratory: No cough, dyspnea Urologic: No hematuria, dysuria Abdominal:   No nausea, vomiting, diarrhea, bright red blood per rectum, melena, or hematemesis Neurologic:  No visual changes, wkns, changes in mental status. All other systems reviewed and are otherwise negative except as noted above.  Physical Exam    VS:  BP 132/82   Pulse 88   Ht 5\' 6"  (1.676 m)   Wt (!) 328 lb 3.2 oz (148.9 kg)   SpO2 97%   BMI 52.97 kg/m  , BMI Body mass index is 52.97 kg/m. GEN: Well nourished, well developed, in no acute distress. HEENT: normal. Neck: Supple, no JVD, carotid bruits, or masses. Cardiac: RRR, no murmurs, rubs, or gallops. No clubbing, cyanosis, edema.  Radials/DP/PT 2+ and equal bilaterally.  Respiratory:  Respirations regular and unlabored, clear to auscultation bilaterally. GI:  Soft, nontender, nondistended, BS + x 4. MS: no deformity or atrophy. Skin: warm and dry, no rash.  Right radial cath site clean dry intact no drainage. Neuro:  Strength and sensation are  intact. Psych: Normal affect.  Accessory Clinical Findings    ECG personally reviewed by me today-normal sinus rhythm possible inferior infarct undetermined age 46 bpm- No acute changes  EKG 12/17/2019 Sinus rhythm possible acute inferior infarct 72 bpm   Echocardiogram 12/14/2019 1. Overall EF preserved small area of inferior basal hypokinesis . Left  ventricular ejection fraction, by estimation, is 55 to 60%. The left  ventricle has normal function. The left ventricle demonstrates regional  wall motion abnormalities (see scoring  diagram/findings for description). There is severely increased left  ventricular hypertrophy. Left ventricular diastolic parameters are  consistent with Grade I diastolic dysfunction (impaired relaxation).  2. Right ventricular systolic function is moderately reduced. The right  ventricular size is moderately enlarged.  3. The mitral valve is normal in structure and function. No evidence of  mitral valve regurgitation. No evidence of mitral stenosis.  4. The aortic valve is tricuspid. Aortic valve regurgitation is not  visualized. Mild to moderate aortic valve sclerosis/calcification is  present, without any evidence of aortic stenosis.  Cardiac catheterization 12/14/2019  Prox RCA-1 lesion is 30% stenosed.  Prox RCA-2 lesion is 100% stenosed.  Mid Cx to Dist Cx lesion is 30% stenosed.  Dist LAD lesion is 30% stenosed.  A drug-eluting stent was successfully placed using a SYNERGY XD 3.0X16.  Post intervention, there is a 0% residual stenosis.  1. Inferior STEMI secondary to thrombotic occlusion of the mid RCA 2. Successful PTCA/DES x 1 mid RCA 3. Mild non-obstructive disease in the proximal RCA, distal Circumflex and distal LAD 4. Elevated  LVEDP  Recommendations: Will continue Aggrastat drip for 12 hours given heavy thrombus burden within the stented segment. Continue ASA and Brilinta for one year. Echo later today. High intensity statin. Beta blocker as heart rate tolerates.    Diagnostic Dominance: Right  Intervention      Assessment & Plan   1.  STEMI-no chest pain today.  PCI with DES x1 to proximal RCA. Continue Brilinta 90 mg twice daily Continue aspirin 81 mg daily Continue metoprolol tartrate 25 mg twice daily Continue valsartan 80 mg daily Continue atorvastatin 80 mg daily Heart healthy low-sodium diet-salty 6 given Increase physical activity as tolerated  Essential hypertension-BP [id number]. Blood pressure cuff given with large cuff. Continue metoprolol tartrate 25 mg twice daily Continue valsartan 80 mg daily Continue atorvastatin 80 mg daily Heart healthy low-sodium diet-salty 6 given Increase physical activity as tolerated Blood pressure log  Hyperlipidemia-12/13/2019: Cholesterol 203; HDL 46; LDL Cholesterol 120; Triglycerides 186; VLDL 37 Continue atorvastatin 80 mg daily Increase physical activity as tolerated Heart healthy low-sodium high-fiber diet  Diabetes mellitus-Target A1c less than 7.  A1c 7.5 12/13/2019 Continue Metformin 500 mg twice daily Heart healthy low-sodium carb modified diet Increase physical activity as tolerated Followed by PCP  Disposition: Follow-up with Dr. Gwenlyn Found in 3 months.    Jossie Ng. [facility] [place] [address] Office [phone] Fax [phone]

## 2019-12-24 NOTE — Telephone Encounter (Signed)
Pt insurance is active and benefits verified through [employer] Co-pay 0, DED 0/0 met, out of pocket 0/0 met, co-insurance 0. no pre-authorization required. Passport, 12/24/2019'@2' :50pm, REF# [id number]  Will contact patient to see if she is interested in the Cardiac Rehab Program. If interested, patient will need to complete follow up appt. Once completed, patient will be contacted for scheduling upon review by the RN Navigator.

## 2019-12-25 ENCOUNTER — Encounter: Payer: Self-pay | Admitting: General Practice

## 2019-12-25 ENCOUNTER — Other Ambulatory Visit: Payer: Self-pay

## 2019-12-25 ENCOUNTER — Ambulatory Visit: Payer: Medicaid Other | Admitting: General Practice

## 2019-12-25 VITALS — BP 132/82 | HR 88 | Ht 66.0 in | Wt 328.2 lb

## 2019-12-25 DIAGNOSIS — E7800 Pure hypercholesterolemia, unspecified: Secondary | ICD-10-CM | POA: Diagnosis not present

## 2019-12-25 DIAGNOSIS — I10 Essential (primary) hypertension: Secondary | ICD-10-CM | POA: Diagnosis not present

## 2019-12-25 DIAGNOSIS — E118 Type 2 diabetes mellitus with unspecified complications: Secondary | ICD-10-CM

## 2019-12-25 DIAGNOSIS — I2111 ST elevation (STEMI) myocardial infarction involving right coronary artery: Secondary | ICD-10-CM | POA: Diagnosis not present

## 2019-12-25 NOTE — Patient Instructions (Signed)
Medication Instructions:  The current medical regimen is effective;  continue present plan and medications as directed. Please refer to the Current Medication list given to you today. If you need a refill on your cardiac medications before your next appointment, please call your pharmacy.  Special Instructions: Take and log your blood pressure daily-one of our care guides will be mailing this to you.  PLEASE READ AND FOLLOW SALTY 6 ATTACHED  PLEASE INCREASE YOUR PHYSICAL ACTIVITY  Follow-Up: 3 months  In Person Quay Burow, MD.    At [facility], you and your health needs are our priority.  As part of our continuing mission to provide you with exceptional heart care, we have created designated Provider Care Teams.  These Care Teams include your primary Cardiologist (physician) and Advanced Practice Providers (APPs -  Physician Assistants and Nurse Practitioners) who all work together to provide you with the care you need, when you need it.  Reduce your risk of getting COVID-19 With your heart disease it is especially important for people at increased risk of severe illness from COVID-19, and those who live with them, to protect themselves from getting COVID-19. The best way to protect yourself and to help reduce the spread of the virus that causes COVID-19 is to: Marland Kitchen Limit your interactions with other people as much as possible. . Take COVID-19 when you do interact with others. If you start feeling sick and think you may have COVID-19, get in touch with your healthcare provider within 24 hours.  Thank you for choosing [facility] at [facility]!!

## 2019-12-26 ENCOUNTER — Telehealth: Payer: Self-pay | Admitting: Licensed Clinical Social Worker

## 2019-12-26 NOTE — Telephone Encounter (Signed)
CSW referred to assist patient with obtaining a BP cuff. CSW contacted patient to inform cuff will be delivered to home. Patient grateful for support and assistance. CSW available as needed. Jackie Cindi Ghazarian, LCSW, CCSW-MCS [phone]  

## 2020-01-03 NOTE — Telephone Encounter (Signed)
Called patient to see if she was interested in participating in the Cardiac Rehab Program. Patient stated yes. Patient will come in for orientation on 01/17/2020@8 :30am and will attend the 9:15am exercise class. Pt will also be getting setup with virtual CR in the orientation.  Mailed homework package.

## 2020-01-14 ENCOUNTER — Telehealth (HOSPITAL_COMMUNITY): Payer: Self-pay

## 2020-01-14 NOTE — Telephone Encounter (Signed)
Cardiac Rehab Note:  Successful telephone encounter to Kristin Reilly to confirm Cardiac Rehab orientation appointment for 01/17/20 at 0830. Patient confirms she will be able to make appointment however is unable to complete health history secondary to being a work. States she works until 6 daily. Will complete health history during orientation appointment.   Laithan Conchas E. Rollene Rotunda RN, BSN [place]. [facility]  Cardiac and Pulmonary Rehabilitation Phone: [phone] Fax: [phone]

## 2020-01-16 ENCOUNTER — Telehealth (HOSPITAL_COMMUNITY): Payer: Self-pay

## 2020-01-16 NOTE — Telephone Encounter (Signed)
Cardiac Rehab Medication Review by a Pharmacist  Does the patient  feel that his/her medications are working for him/her?  yes  Has the patient been experiencing any side effects to the medications prescribed?  no  Does the patient measure his/her own blood pressure?  Yes, once/day   Does the patient have any problems obtaining medications due to transportation or finances?   no  Understanding of regimen: excellent Understanding of indications: excellent Potential of compliance: excellent  Pharmacist comments: n/a  Acey Lav, PharmD  PGY1 Acute Care Pharmacy Resident 01/16/2020 11:33 AM

## 2020-01-17 ENCOUNTER — Encounter (HOSPITAL_COMMUNITY)
Admission: RE | Admit: 2020-01-17 | Discharge: 2020-01-17 | Disposition: A | Discharge status: Home / Self Care | Payer: Medicaid Other | Source: Ambulatory Visit | Attending: Cardiovascular Disease | Admitting: Cardiovascular Disease

## 2020-01-17 ENCOUNTER — Encounter (HOSPITAL_COMMUNITY): Payer: Self-pay

## 2020-01-17 ENCOUNTER — Other Ambulatory Visit: Payer: Self-pay

## 2020-01-17 VITALS — BP 122/78 | HR 92 | Ht 62.25 in | Wt 346.6 lb

## 2020-01-17 DIAGNOSIS — Z955 Presence of coronary angioplasty implant and graft: Secondary | ICD-10-CM | POA: Insufficient documentation

## 2020-01-17 DIAGNOSIS — I2119 ST elevation (STEMI) myocardial infarction involving other coronary artery of inferior wall: Secondary | ICD-10-CM | POA: Insufficient documentation

## 2020-01-17 NOTE — Progress Notes (Signed)
Cardiac Individual Treatment Plan  Patient Details  Name: Kristin Reilly MRN: [id number] Date of Birth: 25-Oct-1966 Referring Provider:     CARDIAC REHAB PHASE II ORIENTATION from 01/17/2020 in [facility]  Referring Provider  Lauree Chandler MD      Initial Encounter Date:    CARDIAC REHAB PHASE II ORIENTATION from 01/17/2020 in [facility]  Date  01/17/20      Visit Diagnosis: Acute ST elevation myocardial infarction (STEMI) of inferior wall [facility])  NSTEMI (non-ST elevated myocardial infarction) [facility])  Status post coronary artery stent placement  Patient's Home Medications on Admission:  Current Outpatient Medications:  .  ACCU-CHEK SOFTCLIX LANCETS lancets, 1 each by Misc.(Non-Drug; Combo Route) route daily., Disp: , Rfl:  .  amitriptyline (ELAVIL) 25 MG tablet, Take 25 mg by mouth at bedtime as needed for sleep. , Disp: , Rfl:  .  aspirin 81 MG chewable tablet, Chew 1 tablet (81 mg total) by mouth daily., Disp:  , Rfl:  .  atorvastatin (LIPITOR) 80 MG tablet, Take 1 tablet (80 mg total) by mouth daily at 6 PM. (Patient taking differently: Take 80 mg by mouth every morning. ), Disp: 30 tablet, Rfl: 11 .  Blood Glucose Monitoring Suppl (GLUCOCOM BLOOD GLUCOSE MONITOR) DEVI, 1 each by Misc.(Non-Drug; Combo Route) route daily. Include strips. Lancets, lancet device, control solution. batteries, Disp: , Rfl:  .  celecoxib (CELEBREX) 200 MG capsule, Take 200 mg by mouth daily. , Disp: , Rfl:  .  cholecalciferol (VITAMIN D3) 25 MCG (1000 UT) tablet, Take 1,000 Units by mouth daily., Disp: , Rfl:  .  glucose blood test strip, 1 each by Misc.(Non-Drug; Combo Route) route daily., Disp: , Rfl:  .  metFORMIN (GLUCOPHAGE-XR) 500 MG 24 hr tablet, Take 1,000 mg by mouth 2 (two) times daily with a meal. , Disp: , Rfl:  .  metoprolol tartrate (LOPRESSOR) 25 MG tablet, Take 1 tablet (25 mg total) by mouth 2 (two) times daily.,  Disp: 60 tablet, Rfl: 11 .  nitroGLYCERIN (NITROSTAT) 0.4 MG SL tablet, Place 1 tablet (0.4 mg total) under the tongue every 5 (five) minutes x 3 doses as needed for chest pain., Disp: 25 tablet, Rfl: 12 .  NON FORMULARY, Place 1 drop into both eyes daily., Disp: , Rfl:  .  nystatin (MYCOSTATIN/NYSTOP) powder, Apply 1 Bottle topically daily as needed (irritation). , Disp: , Rfl:  .  ticagrelor (BRILINTA) 90 MG TABS tablet, Take 1 tablet (90 mg total) by mouth 2 (two) times daily., Disp: 60 tablet, Rfl: 11 .  valsartan (DIOVAN) 80 MG tablet, Take 80 mg by mouth daily. , Disp: , Rfl:  .  vitamin C (ASCORBIC ACID) 500 MG tablet, Take 500 mg by mouth daily., Disp: , Rfl:   Past Medical History: Past Medical History:  Diagnosis Date  . Arthritis   . Depression   . Glaucoma   . Hyperlipidemia   . Hypertension   . Sleep apnea    no cpap    Tobacco Use: Social History   Tobacco Use  Smoking Status Former Smoker  . Quit date: 07/21/2005  . Years since quitting: 14.5  Smokeless Tobacco Never Used  Tobacco Comment   quit 2008    Labs: Recent Review Flowsheet Data    Labs for ITP Cardiac and Pulmonary Rehab Latest Ref Rng & Units 12/13/2019   Cholestrol 0 - 200 mg/dL 203(H)   LDLCALC 0 - 99 mg/dL 120(H)  HDL >40 mg/dL 46   Trlycerides <150 mg/dL 186(H)   Hemoglobin A1c 4.8 - 5.6 % 7.5(H)      Capillary Blood Glucose: Lab Results  Component Value Date   GLUCAP 140 (H) 12/16/2019   GLUCAP 189 (H) 12/16/2019   GLUCAP 161 (H) 12/15/2019   GLUCAP 201 (H) 12/15/2019   GLUCAP 182 (H) 12/15/2019     Exercise Target Goals: Exercise Program Goal: Individual exercise prescription set using results from initial 6 min walk test and THRR while considering  patient's activity barriers and safety.   Exercise Prescription Goal: Initial exercise prescription builds to 30-45 minutes a day of aerobic activity, 2-3 days per week.  Home exercise guidelines will be given to patient during  program as part of exercise prescription that the participant will acknowledge.  Activity Barriers & Risk Stratification: Activity Barriers & Cardiac Risk Stratification - 01/17/20 1029      Activity Barriers & Cardiac Risk Stratification   Activity Barriers  Arthritis;Joint Problems;Deconditioning    Cardiac Risk Stratification  High       6 Minute Walk: 6 Minute Walk    Row Name 01/17/20 1026         6 Minute Walk   Phase  Initial     Distance  1234 feet     Walk Time  6 minutes     # of Rest Breaks  0     MPH  2.34     METS  2.12     RPE  12     Perceived Dyspnea   2     VO2 Peak  7.4     Symptoms  Yes (comment)     Comments  SOB +2     Resting HR  92 bpm     Resting BP  122/78     Resting Oxygen Saturation   100 %     Exercise Oxygen Saturation  during 6 min walk  97 %     Max Ex. HR  120 bpm     Max Ex. BP  130/82     2 Minute Post BP  112/60        Oxygen Initial Assessment:   Oxygen Re-Evaluation:   Oxygen Discharge (Final Oxygen Re-Evaluation):   Initial Exercise Prescription: Initial Exercise Prescription - 01/17/20 1000      Date of Initial Exercise RX and Referring Provider   Date  01/17/20    Referring Provider  Lauree Chandler MD    Expected Discharge Date  03/14/20      Treadmill   MPH  1.4    Grade  0    Minutes  15    METs  2.07      NuStep   Level  2    SPM  85    Minutes  15    METs  2      Prescription Details   Frequency (times per week)  3x    Duration  Progress to 30 minutes of continuous aerobic without signs/symptoms of physical distress      Intensity   THRR 40-80% of Max Heartrate  66-132    Ratings of Perceived Exertion  11-13    Perceived Dyspnea  0-4      Progression   Progression  Continue progressive overload as per policy without signs/symptoms or physical distress.      Resistance Training   Training Prescription  Yes    Weight  2lbs    Reps  10-15       Perform Capillary Blood Glucose checks  as needed.  Exercise Prescription Changes:   Exercise Comments:   Exercise Goals and Review: Exercise Goals    Row Name 01/17/20 1029             Exercise Goals   Increase Physical Activity  Yes       Intervention  Provide advice, education, support and counseling about physical activity/exercise needs.;Develop an individualized exercise prescription for aerobic and resistive training based on initial evaluation findings, risk stratification, comorbidities and participant's personal goals.       Expected Outcomes  Short Term: Attend rehab on a regular basis to increase amount of physical activity.;Long Term: Add in home exercise to make exercise part of routine and to increase amount of physical activity.;Long Term: Exercising regularly at least 3-5 days a week.       Increase Strength and Stamina  Yes       Intervention  Provide advice, education, support and counseling about physical activity/exercise needs.;Develop an individualized exercise prescription for aerobic and resistive training based on initial evaluation findings, risk stratification, comorbidities and participant's personal goals.       Expected Outcomes  Short Term: Increase workloads from initial exercise prescription for resistance, speed, and METs.;Short Term: Perform resistance training exercises routinely during rehab and add in resistance training at home;Long Term: Improve cardiorespiratory fitness, muscular endurance and strength as measured by increased METs and functional capacity (6MWT)       Able to understand and use rate of perceived exertion (RPE) scale  Yes       Intervention  Provide education and explanation on how to use RPE scale       Expected Outcomes  Short Term: Able to use RPE daily in rehab to express subjective intensity level;Long Term:  Able to use RPE to guide intensity level when exercising independently       Knowledge and understanding of Target Heart Rate Range (THRR)  Yes        Intervention  Provide education and explanation of THRR including how the numbers were predicted and where they are located for reference       Expected Outcomes  Short Term: Able to state/look up THRR;Long Term: Able to use THRR to govern intensity when exercising independently;Short Term: Able to use daily as guideline for intensity in rehab       Able to check pulse independently  Yes       Intervention  Provide education and demonstration on how to check pulse in carotid and radial arteries.;Review the importance of being able to check your own pulse for safety during independent exercise       Expected Outcomes  Short Term: Able to explain why pulse checking is important during independent exercise;Long Term: Able to check pulse independently and accurately       Understanding of Exercise Prescription  Yes       Intervention  Provide education, explanation, and written materials on patient's individual exercise prescription       Expected Outcomes  Short Term: Able to explain program exercise prescription;Long Term: Able to explain home exercise prescription to exercise independently          Exercise Goals Re-Evaluation :   Discharge Exercise Prescription (Final Exercise Prescription Changes):   Nutrition:  Target Goals: Understanding of nutrition guidelines, daily intake of sodium 1500mg , cholesterol 200mg , calories 30% from fat and 7% or less from saturated fats, daily to have  5 or more servings of fruits and vegetables.  Biometrics: Pre Biometrics - 01/17/20 1027      Pre Biometrics   Height  5' 2.25" (1.581 m)    Weight  (!) 157.2 kg    Waist Circumference  59 inches    Hip Circumference  60 inches    Waist to Hip Ratio  0.98 %    BMI (Calculated)  62.89    Triceps Skinfold  44 mm    % Body Fat  65.1 %    Grip Strength  38 kg    Flexibility  12 in    Single Leg Stand  5.87 seconds        Nutrition Therapy Plan and Nutrition Goals:   Nutrition  Assessments:   Nutrition Goals Re-Evaluation:   Nutrition Goals Re-Evaluation:   Nutrition Goals Discharge (Final Nutrition Goals Re-Evaluation):   Psychosocial: Target Goals: Acknowledge presence or absence of significant depression and/or stress, maximize coping skills, provide positive support system. Participant is able to verbalize types and ability to use techniques and skills needed for reducing stress and depression.  Initial Review & Psychosocial Screening: Initial Psych Review & Screening - 01/17/20 1052      Initial Review   Current issues with  Current Stress Concerns    Source of Stress Concerns  Chronic Illness;Occupation;Financial;Family    Comments  Kristin Reilly has some concerns related to finances, her health, and family.  She has struggled with her weight since she was a child and is currently on disability.      Family Dynamics   Good Support System?  Yes   Kristin Reilly states she has her son and friends for support.     Barriers   Psychosocial barriers to participate in program  There are no identifiable barriers or psychosocial needs.;The patient should benefit from training in stress management and relaxation.      Screening Interventions   Interventions  To provide support and resources with identified psychosocial needs;Provide feedback about the scores to participant;Encouraged to exercise    Expected Outcomes  Short Term goal: Utilizing psychosocial counselor, staff and physician to assist with identification of specific Stressors or current issues interfering with healing process. Setting desired goal for each stressor or current issue identified.;Short Term goal: Identification and review with participant of any Quality of Life or Depression concerns found by scoring the questionnaire.;Long Term Goal: Stressors or current issues are controlled or eliminated.;Long Term goal: The participant improves quality of Life and PHQ9 Scores as seen by post scores and/or  verbalization of changes       Quality of Life Scores: Quality of Life - 01/17/20 1029      Quality of Life   Select  Quality of Life      Quality of Life Scores   Health/Function Pre  14.73 %    Socioeconomic Pre  18.44 %    Psych/Spiritual Pre  16.93 %    Family Pre  20.2 %    GLOBAL Pre  16.8 %      Scores of 19 and below usually indicate a poorer quality of life in these areas.  A difference of  2-3 points is a clinically meaningful difference.  A difference of 2-3 points in the total score of the Quality of Life Index has been associated with significant improvement in overall quality of life, self-image, physical symptoms, and general health in studies assessing change in quality of life.  PHQ-9: Recent Review Flowsheet Data    There  is no flowsheet data to display.     Interpretation of Total Score  Total Score Depression Severity:  1-4 = Minimal depression, 5-9 = Mild depression, 10-14 = Moderate depression, 15-19 = Moderately severe depression, 20-27 = Severe depression   Psychosocial Evaluation and Intervention:   Psychosocial Re-Evaluation:   Psychosocial Discharge (Final Psychosocial Re-Evaluation):   Vocational Rehabilitation: Provide vocational rehab assistance to qualifying candidates.   Vocational Rehab Evaluation & Intervention: Vocational Rehab - 01/17/20 1003      Initial Vocational Rehab Evaluation & Intervention   Assessment shows need for Vocational Rehabilitation  No       Education: Education Goals: Education classes will be provided on a weekly basis, covering required topics. Participant will state understanding/return demonstration of topics presented.  Learning Barriers/Preferences: Learning Barriers/Preferences - 01/17/20 1030      Learning Barriers/Preferences   Learning Barriers  None    Learning Preferences  Written Material;Verbal Instruction;Skilled Demonstration       Education Topics: Count Your Pulse:  -Group  instruction provided by verbal instruction, demonstration, patient participation and written materials to support subject.  Instructors address importance of being able to find your pulse and how to count your pulse when at home without a heart monitor.  Patients get hands on experience counting their pulse with staff help and individually.   Heart Attack, Angina, and Risk Factor Modification:  -Group instruction provided by verbal instruction, video, and written materials to support subject.  Instructors address signs and symptoms of angina and heart attacks.    Also discuss risk factors for heart disease and how to make changes to improve heart health risk factors.   Functional Fitness:  -Group instruction provided by verbal instruction, demonstration, patient participation, and written materials to support subject.  Instructors address safety measures for doing things around the house.  Discuss how to get up and down off the floor, how to pick things up properly, how to safely get out of a chair without assistance, and balance training.   Meditation and Mindfulness:  -Group instruction provided by verbal instruction, patient participation, and written materials to support subject.  Instructor addresses importance of mindfulness and meditation practice to help reduce stress and improve awareness.  Instructor also leads participants through a meditation exercise.    Stretching for Flexibility and Mobility:  -Group instruction provided by verbal instruction, patient participation, and written materials to support subject.  Instructors lead participants through series of stretches that are designed to increase flexibility thus improving mobility.  These stretches are additional exercise for major muscle groups that are typically performed during regular warm up and cool down.   Hands Only CPR:  -Group verbal, video, and participation provides a basic overview of AHA guidelines for community CPR.  Role-play of emergencies allow participants the opportunity to practice calling for help and chest compression technique with discussion of AED use.   Hypertension: -Group verbal and written instruction that provides a basic overview of hypertension including the most recent diagnostic guidelines, risk factor reduction with self-care instructions and medication management.    Nutrition I class: Heart Healthy Eating:  -Group instruction provided by PowerPoint slides, verbal discussion, and written materials to support subject matter. The instructor gives an explanation and review of the Therapeutic Lifestyle Changes diet recommendations, which includes a discussion on lipid goals, dietary fat, sodium, fiber, plant stanol/sterol esters, sugar, and the components of a well-balanced, healthy diet.   Nutrition II class: Lifestyle Skills:  -Group instruction provided by PowerPoint slides, verbal  discussion, and written materials to support subject matter. The instructor gives an explanation and review of label reading, grocery shopping for heart health, heart healthy recipe modifications, and ways to make healthier choices when eating out.   Diabetes Question & Answer:  -Group instruction provided by PowerPoint slides, verbal discussion, and written materials to support subject matter. The instructor gives an explanation and review of diabetes co-morbidities, pre- and post-prandial blood glucose goals, pre-exercise blood glucose goals, signs, symptoms, and treatment of hypoglycemia and hyperglycemia, and foot care basics.   Diabetes Blitz:  -Group instruction provided by PowerPoint slides, verbal discussion, and written materials to support subject matter. The instructor gives an explanation and review of the physiology behind type 1 and type 2 diabetes, diabetes medications and rational behind using different medications, pre- and post-prandial blood glucose recommendations and Hemoglobin A1c goals,  diabetes diet, and exercise including blood glucose guidelines for exercising safely.    Portion Distortion:  -Group instruction provided by PowerPoint slides, verbal discussion, written materials, and food models to support subject matter. The instructor gives an explanation of serving size versus portion size, changes in portions sizes over the last 20 years, and what consists of a serving from each food group.   Stress Management:  -Group instruction provided by verbal instruction, video, and written materials to support subject matter.  Instructors review role of stress in heart disease and how to cope with stress positively.     Exercising on Your Own:  -Group instruction provided by verbal instruction, power point, and written materials to support subject.  Instructors discuss benefits of exercise, components of exercise, frequency and intensity of exercise, and end points for exercise.  Also discuss use of nitroglycerin and activating EMS.  Review options of places to exercise outside of rehab.  Review guidelines for sex with heart disease.   Cardiac Drugs I:  -Group instruction provided by verbal instruction and written materials to support subject.  Instructor reviews cardiac drug classes: antiplatelets, anticoagulants, beta blockers, and statins.  Instructor discusses reasons, side effects, and lifestyle considerations for each drug class.   Cardiac Drugs II:  -Group instruction provided by verbal instruction and written materials to support subject.  Instructor reviews cardiac drug classes: angiotensin converting enzyme inhibitors (ACE-I), angiotensin II receptor blockers (ARBs), nitrates, and calcium channel blockers.  Instructor discusses reasons, side effects, and lifestyle considerations for each drug class.   Anatomy and Physiology of the Circulatory System:  Group verbal and written instruction and models provide basic cardiac anatomy and physiology, with the coronary  electrical and arterial systems. Review of: AMI, Angina, Valve disease, Heart Failure, Peripheral Artery Disease, Cardiac Arrhythmia, Pacemakers, and the ICD.   Other Education:  -Group or individual verbal, written, or video instructions that support the educational goals of the cardiac rehab program.   Holiday Eating Survival Tips:  -Group instruction provided by PowerPoint slides, verbal discussion, and written materials to support subject matter. The instructor gives patients tips, tricks, and techniques to help them not only survive but enjoy the holidays despite the onslaught of food that accompanies the holidays.   Knowledge Questionnaire Score: Knowledge Questionnaire Score - 01/17/20 0933      Knowledge Questionnaire Score   Pre Score  22/24       Core Components/Risk Factors/Patient Goals at Admission: Personal Goals and Risk Factors at Admission - 01/17/20 1030      Core Components/Risk Factors/Patient Goals on Admission   Diabetes  Yes    Intervention  Provide education about  signs/symptoms and action to take for hypo/hyperglycemia.;Provide education about proper nutrition, including hydration, and aerobic/resistive exercise prescription along with prescribed medications to achieve blood glucose in normal ranges: Fasting glucose 65-99 mg/dL    Expected Outcomes  Short Term: Participant verbalizes understanding of the signs/symptoms and immediate care of hyper/hypoglycemia, proper foot care and importance of medication, aerobic/resistive exercise and nutrition plan for blood glucose control.;Long Term: Attainment of HbA1C < 7%.    Hypertension  Yes    Intervention  Provide education on lifestyle modifcations including regular physical activity/exercise, weight management, moderate sodium restriction and increased consumption of fresh fruit, vegetables, and low fat dairy, alcohol moderation, and smoking cessation.;Monitor prescription use compliance.    Expected Outcomes  Long  Term: Maintenance of blood pressure at goal levels.;Short Term: Continued assessment and intervention until BP is < 140/51mm HG in hypertensive participants. < 130/47mm HG in hypertensive participants with diabetes, heart failure or chronic kidney disease.    Lipids  Yes    Intervention  Provide education and support for participant on nutrition & aerobic/resistive exercise along with prescribed medications to achieve LDL 70mg , HDL >40mg .    Expected Outcomes  Short Term: Participant states understanding of desired cholesterol values and is compliant with medications prescribed. Participant is following exercise prescription and nutrition guidelines.;Long Term: Cholesterol controlled with medications as prescribed, with individualized exercise RX and with personalized nutrition plan. Value goals: LDL < 70mg , HDL > 40 mg.       Core Components/Risk Factors/Patient Goals Review:    Core Components/Risk Factors/Patient Goals at Discharge (Final Review):    ITP Comments: ITP Comments    Row Name 01/17/20 0929           ITP Comments  Dr. Fransico Him, Medical Director          Comments: Patient attended orientation on 01/17/2020 to review rules and guidelines for program.  Completed 6 minute walk test, Intitial ITP, and exercise prescription.  VSS. Telemetry-SR/ST.  Asymptomatic. Safety measures and social distancing in place per CDC guidelines.

## 2020-01-30 ENCOUNTER — Encounter (HOSPITAL_COMMUNITY)
Admission: RE | Admit: 2020-01-30 | Discharge: 2020-01-30 | Disposition: A | Discharge status: Home / Self Care | Payer: Medicaid Other | Source: Ambulatory Visit | Attending: Cardiovascular Disease | Admitting: Cardiovascular Disease

## 2020-01-30 ENCOUNTER — Other Ambulatory Visit: Payer: Self-pay

## 2020-01-30 VITALS — Wt 328.5 lb

## 2020-01-30 DIAGNOSIS — Z955 Presence of coronary angioplasty implant and graft: Secondary | ICD-10-CM | POA: Diagnosis present

## 2020-01-30 DIAGNOSIS — I2119 ST elevation (STEMI) myocardial infarction involving other coronary artery of inferior wall: Secondary | ICD-10-CM

## 2020-01-30 LAB — GLUCOSE, CAPILLARY
Glucose-Capillary: 203 mg/dL — ABNORMAL HIGH (ref 70–99)
Glucose-Capillary: 184 mg/dL — ABNORMAL HIGH (ref 70–99)

## 2020-01-30 NOTE — Progress Notes (Signed)
Daily Session Note  Patient Details  Name: Kristin Reilly MRN: [id number] Date of Birth: September 04, 1966 Referring Provider:     CARDIAC REHAB PHASE II ORIENTATION from 01/17/2020 in [place]  Referring Provider  Kristin Chandler MD      Encounter Date: 01/30/2020  Check In: Session Check In - 01/30/20 0933      Check-In   Supervising physician immediately available to respond to emergencies  [facility] immediately available    Physician(s)  Dr. Sharlet Reilly    Location  [facility]    Staff Present  Kristin Garter, RN, Kristin Melnick, RN, BSN;Kristin Ysidro Evert, RN;Kristin Kris Mouton, MS, Exercise Physiologist;Kristin Celesta Aver, MS, ACSM CEP, Exercise Physiologist    Virtual Visit  No    Medication changes reported      No    Fall or balance concerns reported     No    Tobacco Cessation  No Change    Warm-up and Cool-down  Performed on first and last piece of equipment    Resistance Training Performed  No    VAD Patient?  No    PAD/SET Patient?  No      Pain Assessment   Currently in Pain?  No/denies    Pain Score  0-No pain    Multiple Pain Sites  No       Capillary Blood Glucose: Results for orders placed or performed during the hospital encounter of 01/30/20 (from the past 24 hour(s))  Glucose, capillary     Status: Abnormal   Collection Time: 01/30/20  9:16 AM  Result Value Ref Range   Glucose-Capillary 203 (H) 70 - 99 mg/dL  Glucose, capillary     Status: Abnormal   Collection Time: 01/30/20 10:08 AM  Result Value Ref Range   Glucose-Capillary 184 (H) 70 - 99 mg/dL   POCT Glucose - 01/30/20 1025      POCT Blood Glucose   Pre-Exercise  203 mg/dL    Post-Exercise  184 mg/dL      Exercise Prescription Changes - 01/30/20 1000      Response to Exercise   Blood Pressure (Admit)  114/72    Blood Pressure (Exercise)  138/84    Blood Pressure (Exit)  134/80    Heart Rate (Admit)  96 bpm    Heart Rate (Exercise)   106 bpm    Heart Rate (Exit)  85 bpm    Rating of Perceived Exertion (Exercise)  12    Duration  Continue with 30 min of aerobic exercise without signs/symptoms of physical distress.    Intensity  THRR unchanged      Progression   Progression  Continue to progress workloads to maintain intensity without signs/symptoms of physical distress.      Treadmill   MPH  1.4    Grade  0    Minutes  15    METs  2.07      NuStep   Level  2    SPM  85    Minutes  15    METs  2       Social History   Tobacco Use  Smoking Status Former Smoker  . Quit date: 07/21/2005  . Years since quitting: 14.5  Smokeless Tobacco Never Used  Tobacco Comment   quit 2008    Goals Met:  Exercise tolerated well  Goals Unmet:  Not Applicable  Comments: Pt started cardiac rehab today.  Pt tolerated light exercise without difficulty. VSS, telemetry-SR/ST,  asymptomatic.  Medication list reconciled. Pt denies barriers to medicaiton compliance.  PSYCHOSOCIAL ASSESSMENT:  PHQ-1. Kristin Reilly reports some stress related to her chronic health issues, family, and finances.   Pt oriented to exercise equipment and routine.    Understanding verbalized.  QUALITY OF LIFE SCORE REVIEW  Pt completed Quality of Life survey as a participant in Cardiac Rehab. Scores 21.0 or below are considered low. Pt score very low in several areas Overall 16.80, Health and Function 14.73, socioeconomic 18.44, physiological and spiritual 16.93, family 20.20. Patient quality of life altered by stress related to chronic health issues and financial concerns.  Offered emotional support and reassurance.  Will continue to monitor and intervene as necessary.      Dr. Fransico Reilly is Medical Director for Cardiac Rehab at [facility].

## 2020-02-01 ENCOUNTER — Other Ambulatory Visit: Payer: Self-pay

## 2020-02-01 ENCOUNTER — Encounter (HOSPITAL_COMMUNITY)
Admission: RE | Admit: 2020-02-01 | Discharge: 2020-02-01 | Disposition: A | Discharge status: Home / Self Care | Payer: Medicaid Other | Source: Ambulatory Visit | Attending: Cardiovascular Disease | Admitting: Cardiovascular Disease

## 2020-02-01 DIAGNOSIS — I2119 ST elevation (STEMI) myocardial infarction involving other coronary artery of inferior wall: Secondary | ICD-10-CM | POA: Insufficient documentation

## 2020-02-01 DIAGNOSIS — Z955 Presence of coronary angioplasty implant and graft: Secondary | ICD-10-CM | POA: Insufficient documentation

## 2020-02-01 LAB — GLUCOSE, CAPILLARY
Glucose-Capillary: 189 mg/dL — ABNORMAL HIGH (ref 70–99)
Glucose-Capillary: 160 mg/dL — ABNORMAL HIGH (ref 70–99)

## 2020-02-04 ENCOUNTER — Encounter (HOSPITAL_COMMUNITY): Payer: Medicaid Other

## 2020-02-04 ENCOUNTER — Telehealth (HOSPITAL_COMMUNITY): Payer: Self-pay | Admitting: Nurse Practitioner

## 2020-02-06 ENCOUNTER — Encounter (HOSPITAL_COMMUNITY)
Admission: RE | Admit: 2020-02-06 | Discharge: 2020-02-06 | Disposition: A | Discharge status: Home / Self Care | Payer: Medicaid Other | Source: Ambulatory Visit | Attending: Cardiovascular Disease | Admitting: Cardiovascular Disease

## 2020-02-06 ENCOUNTER — Other Ambulatory Visit: Payer: Self-pay

## 2020-02-06 DIAGNOSIS — I2119 ST elevation (STEMI) myocardial infarction involving other coronary artery of inferior wall: Secondary | ICD-10-CM

## 2020-02-06 DIAGNOSIS — Z955 Presence of coronary angioplasty implant and graft: Secondary | ICD-10-CM

## 2020-02-06 LAB — GLUCOSE, CAPILLARY: Glucose-Capillary: 198 mg/dL — ABNORMAL HIGH (ref 70–99)

## 2020-02-07 NOTE — Progress Notes (Signed)
Cardiac Individual Treatment Plan  Patient Details  Name: Kristin Reilly MRN: [id number] Date of Birth: 1966-10-02 Referring Provider:     CARDIAC REHAB PHASE II ORIENTATION from 01/17/2020 in [place]  Referring Provider  Lauree Chandler MD      Initial Encounter Date:    CARDIAC REHAB PHASE II ORIENTATION from 01/17/2020 in [place]  Date  01/17/20      Visit Diagnosis: Acute ST elevation myocardial infarction (STEMI) of inferior wall [facility])  Status post coronary artery stent placement  Patient's Home Medications on Admission:  Current Outpatient Medications:  .  ACCU-CHEK SOFTCLIX LANCETS lancets, 1 each by Misc.(Non-Drug; Combo Route) route daily., Disp: , Rfl:  .  amitriptyline (ELAVIL) 25 MG tablet, Take 25 mg by mouth at bedtime as needed for sleep. , Disp: , Rfl:  .  aspirin 81 MG chewable tablet, Chew 1 tablet (81 mg total) by mouth daily., Disp:  , Rfl:  .  atorvastatin (LIPITOR) 80 MG tablet, Take 1 tablet (80 mg total) by mouth daily at 6 PM. (Patient taking differently: Take 80 mg by mouth every morning. ), Disp: 30 tablet, Rfl: 11 .  Blood Glucose Monitoring Suppl (GLUCOCOM BLOOD GLUCOSE MONITOR) DEVI, 1 each by Misc.(Non-Drug; Combo Route) route daily. Include strips. Lancets, lancet device, control solution. batteries, Disp: , Rfl:  .  celecoxib (CELEBREX) 200 MG capsule, Take 200 mg by mouth daily. , Disp: , Rfl:  .  cholecalciferol (VITAMIN D3) 25 MCG (1000 UT) tablet, Take 1,000 Units by mouth daily., Disp: , Rfl:  .  glucose blood test strip, 1 each by Misc.(Non-Drug; Combo Route) route daily., Disp: , Rfl:  .  metFORMIN (GLUCOPHAGE-XR) 500 MG 24 hr tablet, Take 1,000 mg by mouth 2 (two) times daily with a meal. , Disp: , Rfl:  .  metoprolol tartrate (LOPRESSOR) 25 MG tablet, Take 1 tablet (25 mg total) by mouth 2 (two) times daily., Disp: 60 tablet, Rfl: 11 .  nitroGLYCERIN (NITROSTAT) 0.4  MG SL tablet, Place 1 tablet (0.4 mg total) under the tongue every 5 (five) minutes x 3 doses as needed for chest pain., Disp: 25 tablet, Rfl: 12 .  NON FORMULARY, Place 1 drop into both eyes daily., Disp: , Rfl:  .  nystatin (MYCOSTATIN/NYSTOP) powder, Apply 1 Bottle topically daily as needed (irritation). , Disp: , Rfl:  .  ticagrelor (BRILINTA) 90 MG TABS tablet, Take 1 tablet (90 mg total) by mouth 2 (two) times daily., Disp: 60 tablet, Rfl: 11 .  valsartan (DIOVAN) 80 MG tablet, Take 80 mg by mouth daily. , Disp: , Rfl:  .  vitamin C (ASCORBIC ACID) 500 MG tablet, Take 500 mg by mouth daily., Disp: , Rfl:   Past Medical History: Past Medical History:  Diagnosis Date  . Arthritis   . Depression   . Glaucoma   . Hyperlipidemia   . Hypertension   . Sleep apnea    no cpap    Tobacco Use: Social History   Tobacco Use  Smoking Status Former Smoker  . Quit date: 07/21/2005  . Years since quitting: 14.5  Smokeless Tobacco Never Used  Tobacco Comment   quit 2008    Labs: Recent Review Flowsheet Data    Labs for ITP Cardiac and Pulmonary Rehab Latest Ref Rng & Units 12/13/2019   Cholestrol 0 - 200 mg/dL 203(H)   LDLCALC 0 - 99 mg/dL 120(H)   HDL >40 mg/dL 46  Trlycerides <150 mg/dL 186(H)   Hemoglobin A1c 4.8 - 5.6 % 7.5(H)      Capillary Blood Glucose: Lab Results  Component Value Date   GLUCAP 198 (H) 02/06/2020   GLUCAP 160 (H) 02/01/2020   GLUCAP 189 (H) 02/01/2020   GLUCAP 184 (H) 01/30/2020   GLUCAP 203 (H) 01/30/2020   POCT Glucose    Row Name 01/30/20 1025             POCT Blood Glucose   Pre-Exercise  203 mg/dL       Post-Exercise  184 mg/dL          Exercise Target Goals: Exercise Program Goal: Individual exercise prescription set using results from initial 6 min walk test and THRR while considering  patient's activity barriers and safety.   Exercise Prescription Goal: Initial exercise prescription builds to 30-45 minutes a day of aerobic  activity, 2-3 days per week.  Home exercise guidelines will be given to patient during program as part of exercise prescription that the participant will acknowledge.  Activity Barriers & Risk Stratification: Activity Barriers & Cardiac Risk Stratification - 01/17/20 1029      Activity Barriers & Cardiac Risk Stratification   Activity Barriers  Arthritis;Joint Problems;Deconditioning    Cardiac Risk Stratification  High       6 Minute Walk: 6 Minute Walk    Row Name 01/17/20 1026         6 Minute Walk   Phase  Initial     Distance  1234 feet     Walk Time  6 minutes     # of Rest Breaks  0     MPH  2.34     METS  2.12     RPE  12     Perceived Dyspnea   2     VO2 Peak  7.4     Symptoms  Yes (comment)     Comments  SOB +2     Resting HR  92 bpm     Resting BP  122/78     Resting Oxygen Saturation   100 %     Exercise Oxygen Saturation  during 6 min walk  97 %     Max Ex. HR  120 bpm     Max Ex. BP  130/82     2 Minute Post BP  112/60        Oxygen Initial Assessment:   Oxygen Re-Evaluation:   Oxygen Discharge (Final Oxygen Re-Evaluation):   Initial Exercise Prescription: Initial Exercise Prescription - 01/17/20 1000      Date of Initial Exercise RX and Referring Provider   Date  01/17/20    Referring Provider  Lauree Chandler MD    Expected Discharge Date  03/14/20      Treadmill   MPH  1.4    Grade  0    Minutes  15    METs  2.07      NuStep   Level  2    SPM  85    Minutes  15    METs  2      Prescription Details   Frequency (times per week)  3x    Duration  Progress to 30 minutes of continuous aerobic without signs/symptoms of physical distress      Intensity   THRR 40-80% of Max Heartrate  66-132    Ratings of Perceived Exertion  11-13    Perceived Dyspnea  0-4  Progression   Progression  Continue progressive overload as per policy without signs/symptoms or physical distress.      Resistance Training   Training Prescription   Yes    Weight  2lbs    Reps  10-15       Perform Capillary Blood Glucose checks as needed.  Exercise Prescription Changes: Exercise Prescription Changes    Row Name 01/30/20 1000 02/06/20 1418           Response to Exercise   Blood Pressure (Admit)  114/72  124/72      Blood Pressure (Exercise)  138/84  124/80      Blood Pressure (Exit)  134/80  144/80      Heart Rate (Admit)  96 bpm  90 bpm      Heart Rate (Exercise)  106 bpm  120 bpm      Heart Rate (Exit)  85 bpm  90 bpm      Rating of Perceived Exertion (Exercise)  12  13      Perceived Dyspnea (Exercise)  --  0      Symptoms  --  None      Comments  --  Pt tolerated exercise well      Duration  Continue with 30 min of aerobic exercise without signs/symptoms of physical distress.  Continue with 30 min of aerobic exercise without signs/symptoms of physical distress.      Intensity  THRR unchanged  THRR unchanged        Progression   Progression  Continue to progress workloads to maintain intensity without signs/symptoms of physical distress.  Continue to progress workloads to maintain intensity without signs/symptoms of physical distress.      Average METs  --  2.2        Resistance Training   Training Prescription  --  No        Treadmill   MPH  1.4  2      Grade  0  0      Minutes  15  15      METs  2.07  2.53        NuStep   Level  2  2      SPM  85  85      Minutes  15  15      METs  2  1.8         Exercise Comments: Exercise Comments    Row Name 01/30/20 1021           Exercise Comments  Pt tolerated her first day of exercise well.          Exercise Goals and Review: Exercise Goals    Row Name 01/17/20 1029             Exercise Goals   Increase Physical Activity  Yes       Intervention  Provide advice, education, support and counseling about physical activity/exercise needs.;Develop an individualized exercise prescription for aerobic and resistive training based on initial evaluation  findings, risk stratification, comorbidities and participant's personal goals.       Expected Outcomes  Short Term: Attend rehab on a regular basis to increase amount of physical activity.;Long Term: Add in home exercise to make exercise part of routine and to increase amount of physical activity.;Long Term: Exercising regularly at least 3-5 days a week.       Increase Strength and Stamina  Yes       Intervention  Provide advice, education, support and counseling about physical activity/exercise needs.;Develop an individualized exercise prescription for aerobic and resistive training based on initial evaluation findings, risk stratification, comorbidities and participant's personal goals.       Expected Outcomes  Short Term: Increase workloads from initial exercise prescription for resistance, speed, and METs.;Short Term: Perform resistance training exercises routinely during rehab and add in resistance training at home;Long Term: Improve cardiorespiratory fitness, muscular endurance and strength as measured by increased METs and functional capacity (6MWT)       Able to understand and use rate of perceived exertion (RPE) scale  Yes       Intervention  Provide education and explanation on how to use RPE scale       Expected Outcomes  Short Term: Able to use RPE daily in rehab to express subjective intensity level;Long Term:  Able to use RPE to guide intensity level when exercising independently       Knowledge and understanding of Target Heart Rate Range (THRR)  Yes       Intervention  Provide education and explanation of THRR including how the numbers were predicted and where they are located for reference       Expected Outcomes  Short Term: Able to state/look up THRR;Long Term: Able to use THRR to govern intensity when exercising independently;Short Term: Able to use daily as guideline for intensity in rehab       Able to check pulse independently  Yes       Intervention  Provide education and  demonstration on how to check pulse in carotid and radial arteries.;Review the importance of being able to check your own pulse for safety during independent exercise       Expected Outcomes  Short Term: Able to explain why pulse checking is important during independent exercise;Long Term: Able to check pulse independently and accurately       Understanding of Exercise Prescription  Yes       Intervention  Provide education, explanation, and written materials on patient's individual exercise prescription       Expected Outcomes  Short Term: Able to explain program exercise prescription;Long Term: Able to explain home exercise prescription to exercise independently          Exercise Goals Re-Evaluation :   Discharge Exercise Prescription (Final Exercise Prescription Changes): Exercise Prescription Changes - 02/06/20 1418      Response to Exercise   Blood Pressure (Admit)  124/72    Blood Pressure (Exercise)  124/80    Blood Pressure (Exit)  144/80    Heart Rate (Admit)  90 bpm    Heart Rate (Exercise)  120 bpm    Heart Rate (Exit)  90 bpm    Rating of Perceived Exertion (Exercise)  13    Perceived Dyspnea (Exercise)  0    Symptoms  None    Comments  Pt tolerated exercise well    Duration  Continue with 30 min of aerobic exercise without signs/symptoms of physical distress.    Intensity  THRR unchanged      Progression   Progression  Continue to progress workloads to maintain intensity without signs/symptoms of physical distress.    Average METs  2.2      Resistance Training   Training Prescription  No      Treadmill   MPH  2    Grade  0    Minutes  15    METs  2.53      NuStep  Level  2    SPM  85    Minutes  15    METs  1.8       Nutrition:  Target Goals: Understanding of nutrition guidelines, daily intake of sodium 1500mg , cholesterol 200mg , calories 30% from fat and 7% or less from saturated fats, daily to have 5 or more servings of fruits and  vegetables.  Biometrics: Pre Biometrics - 01/17/20 1027      Pre Biometrics   Height  5' 2.25" (1.581 m)    Weight  (!) 157.2 kg    Waist Circumference  59 inches    Hip Circumference  60 inches    Waist to Hip Ratio  0.98 %    BMI (Calculated)  62.89    Triceps Skinfold  44 mm    % Body Fat  65.1 %    Grip Strength  38 kg    Flexibility  12 in    Single Leg Stand  5.87 seconds        Nutrition Therapy Plan and Nutrition Goals:   Nutrition Assessments:   Nutrition Goals Re-Evaluation:   Nutrition Goals Re-Evaluation:   Nutrition Goals Discharge (Final Nutrition Goals Re-Evaluation):   Psychosocial: Target Goals: Acknowledge presence or absence of significant depression and/or stress, maximize coping skills, provide positive support system. Participant is able to verbalize types and ability to use techniques and skills needed for reducing stress and depression.  Initial Review & Psychosocial Screening: Initial Psych Review & Screening - 01/17/20 1052      Initial Review   Current issues with  Current Stress Concerns    Source of Stress Concerns  Chronic Illness;Occupation;Financial;Family    Comments  [name] has some concerns related to finances, her health, and family.  She has struggled with her weight since she was a child and is currently on disability.      Family Dynamics   Good Support System?  Yes   [name] states she has her son and friends for support.     Barriers   Psychosocial barriers to participate in program  There are no identifiable barriers or psychosocial needs.;The patient should benefit from training in stress management and relaxation.      Screening Interventions   Interventions  To provide support and resources with identified psychosocial needs;Provide feedback about the scores to participant;Encouraged to exercise    Expected Outcomes  Short Term goal: Utilizing psychosocial counselor, staff and physician to assist with identification  of specific Stressors or current issues interfering with healing process. Setting desired goal for each stressor or current issue identified.;Short Term goal: Identification and review with participant of any Quality of Life or Depression concerns found by scoring the questionnaire.;Long Term Goal: Stressors or current issues are controlled or eliminated.;Long Term goal: The participant improves quality of Life and PHQ9 Scores as seen by post scores and/or verbalization of changes       Quality of Life Scores: Quality of Life - 01/17/20 1029      Quality of Life   Select  Quality of Life      Quality of Life Scores   Health/Function Pre  14.73 %    Socioeconomic Pre  18.44 %    Psych/Spiritual Pre  16.93 %    Family Pre  20.2 %    GLOBAL Pre  16.8 %      Scores of 19 and below usually indicate a poorer quality of life in these areas.  A difference of  2-3 points  is a clinically meaningful difference.  A difference of 2-3 points in the total score of the Quality of Life Index has been associated with significant improvement in overall quality of life, self-image, physical symptoms, and general health in studies assessing change in quality of life.  PHQ-9: Recent Review Flowsheet Data    Depression screen [facility] 2/9 01/30/2020 01/17/2020   Decreased Interest 0 -   Down, Depressed, Hopeless 1 0   PHQ - 2 Score 1 0     Interpretation of Total Score  Total Score Depression Severity:  1-4 = Minimal depression, 5-9 = Mild depression, 10-14 = Moderate depression, 15-19 = Moderately severe depression, 20-27 = Severe depression   Psychosocial Evaluation and Intervention: Psychosocial Evaluation - 01/30/20 1706      Psychosocial Evaluation & Interventions   Interventions  Stress management education;Encouraged to exercise with the program and follow exercise prescription;Relaxation education    Comments  [name] has stress concerns related to her chronic health issues, family, and finances.     Expected Outcomes  [name] will report ability to use healthy coping skills to manage her stress.    Continue Psychosocial Services   Follow up required by staff       Psychosocial Re-Evaluation: Psychosocial Re-Evaluation    [place] Name 02/07/20 1429             Psychosocial Re-Evaluation   Current issues with  Current Stress Concerns       Comments  [name] reports stress related to her health and financial situations.  She denies any psychosocial interventions at this time.       Expected Outcomes  [name] will report ability to manage her stress.       Interventions  Encouraged to attend Cardiac Rehabilitation for the exercise       Continue Psychosocial Services   Follow up required by staff          Psychosocial Discharge (Final Psychosocial Re-Evaluation): Psychosocial Re-Evaluation - 02/07/20 1429      Psychosocial Re-Evaluation   Current issues with  Current Stress Concerns    Comments  [name] reports stress related to her health and financial situations.  She denies any psychosocial interventions at this time.    Expected Outcomes  [name] will report ability to manage her stress.    Interventions  Encouraged to attend Cardiac Rehabilitation for the exercise    Continue Psychosocial Services   Follow up required by staff       Vocational Rehabilitation: Provide vocational rehab assistance to qualifying candidates.   Vocational Rehab Evaluation & Intervention: Vocational Rehab - 01/17/20 1003      Initial Vocational Rehab Evaluation & Intervention   Assessment shows need for Vocational Rehabilitation  No       Education: Education Goals: Education classes will be provided on a weekly basis, covering required topics. Participant will state understanding/return demonstration of topics presented.  Learning Barriers/Preferences: Learning Barriers/Preferences - 01/17/20 1030      Learning Barriers/Preferences   Learning Barriers  None    Learning Preferences   Written Material;Verbal Instruction;Skilled Demonstration       Education Topics: Count Your Pulse:  -Group instruction provided by verbal instruction, demonstration, patient participation and written materials to support subject.  Instructors address importance of being able to find your pulse and how to count your pulse when at home without a heart monitor.  Patients get hands on experience counting their pulse with staff help and individually.   Heart Attack, Angina, and  Risk Factor Modification:  -Group instruction provided by verbal instruction, video, and written materials to support subject.  Instructors address signs and symptoms of angina and heart attacks.    Also discuss risk factors for heart disease and how to make changes to improve heart health risk factors.   Functional Fitness:  -Group instruction provided by verbal instruction, demonstration, patient participation, and written materials to support subject.  Instructors address safety measures for doing things around the house.  Discuss how to get up and down off the floor, how to pick things up properly, how to safely get out of a chair without assistance, and balance training.   Meditation and Mindfulness:  -Group instruction provided by verbal instruction, patient participation, and written materials to support subject.  Instructor addresses importance of mindfulness and meditation practice to help reduce stress and improve awareness.  Instructor also leads participants through a meditation exercise.    Stretching for Flexibility and Mobility:  -Group instruction provided by verbal instruction, patient participation, and written materials to support subject.  Instructors lead participants through series of stretches that are designed to increase flexibility thus improving mobility.  These stretches are additional exercise for major muscle groups that are typically performed during regular warm up and cool down.   Hands  Only CPR:  -Group verbal, video, and participation provides a basic overview of AHA guidelines for community CPR. Role-play of emergencies allow participants the opportunity to practice calling for help and chest compression technique with discussion of AED use.   Hypertension: -Group verbal and written instruction that provides a basic overview of hypertension including the most recent diagnostic guidelines, risk factor reduction with self-care instructions and medication management.    Nutrition I class: Heart Healthy Eating:  -Group instruction provided by PowerPoint slides, verbal discussion, and written materials to support subject matter. The instructor gives an explanation and review of the Therapeutic Lifestyle Changes diet recommendations, which includes a discussion on lipid goals, dietary fat, sodium, fiber, plant stanol/sterol esters, sugar, and the components of a well-balanced, healthy diet.   Nutrition II class: Lifestyle Skills:  -Group instruction provided by PowerPoint slides, verbal discussion, and written materials to support subject matter. The instructor gives an explanation and review of label reading, grocery shopping for heart health, heart healthy recipe modifications, and ways to make healthier choices when eating out.   Diabetes Question & Answer:  -Group instruction provided by PowerPoint slides, verbal discussion, and written materials to support subject matter. The instructor gives an explanation and review of diabetes co-morbidities, pre- and post-prandial blood glucose goals, pre-exercise blood glucose goals, signs, symptoms, and treatment of hypoglycemia and hyperglycemia, and foot care basics.   Diabetes Blitz:  -Group instruction provided by PowerPoint slides, verbal discussion, and written materials to support subject matter. The instructor gives an explanation and review of the physiology behind type 1 and type 2 diabetes, diabetes medications and rational  behind using different medications, pre- and post-prandial blood glucose recommendations and Hemoglobin A1c goals, diabetes diet, and exercise including blood glucose guidelines for exercising safely.    Portion Distortion:  -Group instruction provided by PowerPoint slides, verbal discussion, written materials, and food models to support subject matter. The instructor gives an explanation of serving size versus portion size, changes in portions sizes over the last 20 years, and what consists of a serving from each food group.   Stress Management:  -Group instruction provided by verbal instruction, video, and written materials to support subject matter.  Instructors review role of  stress in heart disease and how to cope with stress positively.     Exercising on Your Own:  -Group instruction provided by verbal instruction, power point, and written materials to support subject.  Instructors discuss benefits of exercise, components of exercise, frequency and intensity of exercise, and end points for exercise.  Also discuss use of nitroglycerin and activating EMS.  Review options of places to exercise outside of rehab.  Review guidelines for sex with heart disease.   Cardiac Drugs I:  -Group instruction provided by verbal instruction and written materials to support subject.  Instructor reviews cardiac drug classes: antiplatelets, anticoagulants, beta blockers, and statins.  Instructor discusses reasons, side effects, and lifestyle considerations for each drug class.   Cardiac Drugs II:  -Group instruction provided by verbal instruction and written materials to support subject.  Instructor reviews cardiac drug classes: angiotensin converting enzyme inhibitors (ACE-I), angiotensin II receptor blockers (ARBs), nitrates, and calcium channel blockers.  Instructor discusses reasons, side effects, and lifestyle considerations for each drug class.   Anatomy and Physiology of the Circulatory System:   Group verbal and written instruction and models provide basic cardiac anatomy and physiology, with the coronary electrical and arterial systems. Review of: AMI, Angina, Valve disease, Heart Failure, Peripheral Artery Disease, Cardiac Arrhythmia, Pacemakers, and the ICD.   Other Education:  -Group or individual verbal, written, or video instructions that support the educational goals of the cardiac rehab program.   Holiday Eating Survival Tips:  -Group instruction provided by PowerPoint slides, verbal discussion, and written materials to support subject matter. The instructor gives patients tips, tricks, and techniques to help them not only survive but enjoy the holidays despite the onslaught of food that accompanies the holidays.   Knowledge Questionnaire Score: Knowledge Questionnaire Score - 01/17/20 0933      Knowledge Questionnaire Score   Pre Score  22/24       Core Components/Risk Factors/Patient Goals at Admission: Personal Goals and Risk Factors at Admission - 01/17/20 1030      Core Components/Risk Factors/Patient Goals on Admission   Diabetes  Yes    Intervention  Provide education about signs/symptoms and action to take for hypo/hyperglycemia.;Provide education about proper nutrition, including hydration, and aerobic/resistive exercise prescription along with prescribed medications to achieve blood glucose in normal ranges: Fasting glucose 65-99 mg/dL    Expected Outcomes  Short Term: Participant verbalizes understanding of the signs/symptoms and immediate care of hyper/hypoglycemia, proper foot care and importance of medication, aerobic/resistive exercise and nutrition plan for blood glucose control.;Long Term: Attainment of HbA1C < 7%.    Hypertension  Yes    Intervention  Provide education on lifestyle modifcations including regular physical activity/exercise, weight management, moderate sodium restriction and increased consumption of fresh fruit, vegetables, and low fat  dairy, alcohol moderation, and smoking cessation.;Monitor prescription use compliance.    Expected Outcomes  Long Term: Maintenance of blood pressure at goal levels.;Short Term: Continued assessment and intervention until BP is < 140/59mm HG in hypertensive participants. < 130/13mm HG in hypertensive participants with diabetes, heart failure or chronic kidney disease.    Lipids  Yes    Intervention  Provide education and support for participant on nutrition & aerobic/resistive exercise along with prescribed medications to achieve LDL 70mg , HDL >40mg .    Expected Outcomes  Short Term: Participant states understanding of desired cholesterol values and is compliant with medications prescribed. Participant is following exercise prescription and nutrition guidelines.;Long Term: Cholesterol controlled with medications as prescribed, with individualized exercise RX and  with personalized nutrition plan. Value goals: LDL < 70mg , HDL > 40 mg.       Core Components/Risk Factors/Patient Goals Review:  Goals and Risk Factor Review    Row Name 01/30/20 1708             Core Components/Risk Factors/Patient Goals Review   Personal Goals Review  Diabetes;Hypertension;Lipids       Review  Pt with multiple CAD RFs willing to participate in CR exercise.  [name] would like to improve her heart health and decrease her risk of another CV event.       Expected Outcomes  [name] will continue to participate in CR exercise to decrease her risk of another CV event.          Core Components/Risk Factors/Patient Goals at Discharge (Final Review):  Goals and Risk Factor Review - 01/30/20 1708      Core Components/Risk Factors/Patient Goals Review   Personal Goals Review  Diabetes;Hypertension;Lipids    Review  Pt with multiple CAD RFs willing to participate in CR exercise.  [name] would like to improve her heart health and decrease her risk of another CV event.    Expected Outcomes  [name] will continue to  participate in CR exercise to decrease her risk of another CV event.       ITP Comments: ITP Comments    Row Name 01/17/20 0929 01/30/20 1705         ITP Comments  Dr. Fransico Him, Medical Director  30 Day ITP Review. [name] started exercise today and tolerated it well.         Comments: See ITP Comments.

## 2020-02-08 ENCOUNTER — Other Ambulatory Visit: Payer: Self-pay

## 2020-02-08 ENCOUNTER — Ambulatory Visit: Payer: Medicaid Other | Attending: Internal Medicine

## 2020-02-08 ENCOUNTER — Encounter (HOSPITAL_COMMUNITY)
Admission: RE | Admit: 2020-02-08 | Discharge: 2020-02-08 | Disposition: A | Discharge status: Home / Self Care | Payer: Medicaid Other | Source: Ambulatory Visit | Attending: Cardiovascular Disease | Admitting: Cardiovascular Disease

## 2020-02-08 DIAGNOSIS — I2119 ST elevation (STEMI) myocardial infarction involving other coronary artery of inferior wall: Secondary | ICD-10-CM

## 2020-02-08 DIAGNOSIS — Z955 Presence of coronary angioplasty implant and graft: Secondary | ICD-10-CM

## 2020-02-08 DIAGNOSIS — Z23 Encounter for immunization: Secondary | ICD-10-CM

## 2020-02-08 NOTE — Progress Notes (Signed)
[name] 54 y.o. female Nutrition Note  Visit Diagnosis: Acute ST elevation myocardial infarction (STEMI) of inferior wall [facility])  Status post coronary artery stent placement  Past Medical History:  Diagnosis Date  . Arthritis   . Depression   . Glaucoma   . Hyperlipidemia   . Hypertension   . Sleep apnea    no cpap     Medications reviewed.   Current Outpatient Medications:  .  ACCU-CHEK SOFTCLIX LANCETS lancets, 1 each by Misc.(Non-Drug; Combo Route) route daily., Disp: , Rfl:  .  amitriptyline (ELAVIL) 25 MG tablet, Take 25 mg by mouth at bedtime as needed for sleep. , Disp: , Rfl:  .  aspirin 81 MG chewable tablet, Chew 1 tablet (81 mg total) by mouth daily., Disp:  , Rfl:  .  atorvastatin (LIPITOR) 80 MG tablet, Take 1 tablet (80 mg total) by mouth daily at 6 PM. (Patient taking differently: Take 80 mg by mouth every morning. ), Disp: 30 tablet, Rfl: 11 .  Blood Glucose Monitoring Suppl (GLUCOCOM BLOOD GLUCOSE MONITOR) DEVI, 1 each by Misc.(Non-Drug; Combo Route) route daily. Include strips. Lancets, lancet device, control solution. batteries, Disp: , Rfl:  .  celecoxib (CELEBREX) 200 MG capsule, Take 200 mg by mouth daily. , Disp: , Rfl:  .  cholecalciferol (VITAMIN D3) 25 MCG (1000 UT) tablet, Take 1,000 Units by mouth daily., Disp: , Rfl:  .  glucose blood test strip, 1 each by Misc.(Non-Drug; Combo Route) route daily., Disp: , Rfl:  .  metFORMIN (GLUCOPHAGE-XR) 500 MG 24 hr tablet, Take 1,000 mg by mouth 2 (two) times daily with a meal. , Disp: , Rfl:  .  metoprolol tartrate (LOPRESSOR) 25 MG tablet, Take 1 tablet (25 mg total) by mouth 2 (two) times daily., Disp: 60 tablet, Rfl: 11 .  nitroGLYCERIN (NITROSTAT) 0.4 MG SL tablet, Place 1 tablet (0.4 mg total) under the tongue every 5 (five) minutes x 3 doses as needed for chest pain., Disp: 25 tablet, Rfl: 12 .  NON FORMULARY, Place 1 drop into both eyes daily., Disp: , Rfl:  .  nystatin (MYCOSTATIN/NYSTOP) powder,  Apply 1 Bottle topically daily as needed (irritation). , Disp: , Rfl:  .  ticagrelor (BRILINTA) 90 MG TABS tablet, Take 1 tablet (90 mg total) by mouth 2 (two) times daily., Disp: 60 tablet, Rfl: 11 .  valsartan (DIOVAN) 80 MG tablet, Take 80 mg by mouth daily. , Disp: , Rfl:  .  vitamin C (ASCORBIC ACID) 500 MG tablet, Take 500 mg by mouth daily., Disp: , Rfl:    Ht Readings from Last 1 Encounters:  01/17/20 5' 2.25" (1.581 m)     Wt Readings from Last 3 Encounters:  01/30/20 (!) 328 lb 7.8 oz (149 kg)  01/17/20 (!) 346 lb 9 oz (157.2 kg)  12/25/19 (!) 328 lb 3.2 oz (148.9 kg)     There is no height or weight on file to calculate BMI.   Social History   Tobacco Use  Smoking Status Former Smoker  . Quit date: 07/21/2005  . Years since quitting: 14.5  Smokeless Tobacco Never Used  Tobacco Comment   quit 2008     Lab Results  Component Value Date   CHOL 203 (H) 12/13/2019   Lab Results  Component Value Date   HDL 46 12/13/2019   Lab Results  Component Value Date   LDLCALC 120 (H) 12/13/2019   Lab Results  Component Value Date   TRIG 186 (H) 12/13/2019  Lab Results  Component Value Date   HGBA1C 7.5 (H) 12/13/2019     CBG (last 3)  Recent Labs    02/06/20 0904  GLUCAP 198*     Nutrition Note  Spoke with pt. Nutrition Plan and Nutrition Survey goals reviewed with pt.  Pt has Type 2 Diabetes. Pt checks CBG's 1 times a day. Fasting CBG's reportedly 136-200 mg/dL.   Per discussion, pt does use canned/convenience foods often. Pt does add salt to food when cooking but not on her plate. Pt eats out about 2-3x/week, typically takeout.  She is a good cook and enjoys cooking. She is interested in finding heart healthy alternatives while preserving the taste and quality of her food. Today we reviewed fats and she will substitute butter for olive oil when appropriate.  She does not drink any sugary beverages. She has a lot of food restrictions she tries to  follow in regards to desserts.  Pt expressed understanding of the information reviewed. Will continue to work with pt to make appropriate diet changes as she continues to reach her process goals during rehab.   Nutrition Diagnosis ? Excessive carbohydrate intake related to food preferences and consumption of convenience foods as evidenced by A1C 7.5 and fasting CBG 136-200 mg/dl and triglycerides 186 mg/dl  Nutrition Intervention ? Pt's individual nutrition plan reviewed with pt. ? Benefits of adopting Heart Healthy diet discussed when Medficts reviewed.   ? Pt given handouts for: ? Nutrition I class ?  ? Continue client-centered nutrition education by RD, as part of interdisciplinary care.  Goal(s) ? Pt to build a healthy plate including vegetables, fruits, whole grains, and low-fat dairy products in a heart healthy meal plan. ? CBG concentrations in the normal range or as close to normal as is safely possible. ? Improved blood glucose control as evidenced by pt's A1c trending from 7.5 toward less than 7.0.  Plan:   Will provide client-centered nutrition education as part of interdisciplinary care  Monitor and evaluate progress toward nutrition goal with team.   [name], MS, RDN, LDN

## 2020-02-08 NOTE — Progress Notes (Signed)
   Covid-19 Vaccination Clinic  Name:  Kristin Reilly    MRN: [id number] DOB: 1966/02/02  02/08/2020  [name] was observed post Covid-19 immunization for 15 minutes without incident. She was provided with Vaccine Information Sheet and instruction to access the V-Safe system.   [name] was instructed to call 911 with any severe reactions post vaccine: Marland Kitchen Difficulty breathing  . Swelling of face and throat  . A fast heartbeat  . A bad rash all over body  . Dizziness and weakness   Immunizations Administered    Name Date Dose VIS Date Route   Pfizer COVID-19 Vaccine 02/08/2020 11:04 AM 0.3 mL 10/12/2019 Intramuscular   Manufacturer: [place]   Lot: [id number]   [place]: [id number]

## 2020-02-11 ENCOUNTER — Encounter (HOSPITAL_COMMUNITY)
Admission: RE | Admit: 2020-02-11 | Discharge: 2020-02-11 | Disposition: A | Discharge status: Home / Self Care | Payer: Medicaid Other | Source: Ambulatory Visit | Attending: Cardiovascular Disease | Admitting: Cardiovascular Disease

## 2020-02-11 ENCOUNTER — Other Ambulatory Visit: Payer: Self-pay

## 2020-02-11 DIAGNOSIS — Z955 Presence of coronary angioplasty implant and graft: Secondary | ICD-10-CM

## 2020-02-11 DIAGNOSIS — I2119 ST elevation (STEMI) myocardial infarction involving other coronary artery of inferior wall: Secondary | ICD-10-CM | POA: Diagnosis not present

## 2020-02-13 ENCOUNTER — Encounter (HOSPITAL_COMMUNITY): Payer: Medicaid Other

## 2020-02-15 ENCOUNTER — Encounter (HOSPITAL_COMMUNITY): Payer: Medicaid Other

## 2020-02-18 ENCOUNTER — Other Ambulatory Visit: Payer: Self-pay

## 2020-02-18 ENCOUNTER — Encounter (HOSPITAL_COMMUNITY)
Admission: RE | Admit: 2020-02-18 | Discharge: 2020-02-18 | Disposition: A | Discharge status: Home / Self Care | Payer: Medicaid Other | Source: Ambulatory Visit | Attending: Cardiovascular Disease | Admitting: Cardiovascular Disease

## 2020-02-18 DIAGNOSIS — I2119 ST elevation (STEMI) myocardial infarction involving other coronary artery of inferior wall: Secondary | ICD-10-CM

## 2020-02-18 DIAGNOSIS — Z955 Presence of coronary angioplasty implant and graft: Secondary | ICD-10-CM

## 2020-02-20 ENCOUNTER — Other Ambulatory Visit: Payer: Self-pay

## 2020-02-20 ENCOUNTER — Encounter (HOSPITAL_COMMUNITY)
Admission: RE | Admit: 2020-02-20 | Discharge: 2020-02-20 | Disposition: A | Discharge status: Home / Self Care | Payer: Medicaid Other | Source: Ambulatory Visit | Attending: Cardiovascular Disease | Admitting: Cardiovascular Disease

## 2020-02-20 DIAGNOSIS — I2119 ST elevation (STEMI) myocardial infarction involving other coronary artery of inferior wall: Secondary | ICD-10-CM

## 2020-02-20 DIAGNOSIS — Z955 Presence of coronary angioplasty implant and graft: Secondary | ICD-10-CM

## 2020-02-22 ENCOUNTER — Ambulatory Visit: Payer: Medicaid Other | Admitting: Podiatry

## 2020-02-22 ENCOUNTER — Other Ambulatory Visit: Payer: Self-pay

## 2020-02-22 ENCOUNTER — Encounter (HOSPITAL_COMMUNITY)
Admission: RE | Admit: 2020-02-22 | Discharge: 2020-02-22 | Disposition: A | Discharge status: Home / Self Care | Payer: Medicaid Other | Source: Ambulatory Visit | Attending: Cardiovascular Disease | Admitting: Cardiovascular Disease

## 2020-02-22 DIAGNOSIS — I2119 ST elevation (STEMI) myocardial infarction involving other coronary artery of inferior wall: Secondary | ICD-10-CM

## 2020-02-22 DIAGNOSIS — Z955 Presence of coronary angioplasty implant and graft: Secondary | ICD-10-CM

## 2020-02-25 ENCOUNTER — Encounter (HOSPITAL_COMMUNITY): Payer: Medicaid Other

## 2020-02-27 ENCOUNTER — Encounter (HOSPITAL_COMMUNITY)
Admission: RE | Admit: 2020-02-27 | Discharge: 2020-02-27 | Disposition: A | Discharge status: Home / Self Care | Payer: Medicaid Other | Source: Ambulatory Visit | Attending: Cardiovascular Disease | Admitting: Cardiovascular Disease

## 2020-02-27 ENCOUNTER — Other Ambulatory Visit: Payer: Self-pay

## 2020-02-27 DIAGNOSIS — I2119 ST elevation (STEMI) myocardial infarction involving other coronary artery of inferior wall: Secondary | ICD-10-CM

## 2020-02-27 DIAGNOSIS — Z955 Presence of coronary angioplasty implant and graft: Secondary | ICD-10-CM

## 2020-02-27 NOTE — Progress Notes (Signed)
Kristin Reilly is noted to have a moderate bruise below her right knee. Patient says slipped on a dryer sheet in her laundry room. Patient says that she iced it at home and that it feels better today. Patient did not walk on the treadmill today and advised to follow up with her PCP if needed.Barnet Pall, RN,BSN 02/27/2020 10:20 AM

## 2020-02-29 ENCOUNTER — Telehealth (HOSPITAL_COMMUNITY): Payer: Self-pay | Admitting: *Deleted

## 2020-02-29 ENCOUNTER — Encounter (HOSPITAL_COMMUNITY): Payer: Medicaid Other

## 2020-02-29 NOTE — Telephone Encounter (Signed)
[name] called yesterday to notify us that she will not attending exercise today due to a doctor appointment.Barnet Pall, RN,BSN 02/29/2020 8:36 AM

## 2020-03-03 ENCOUNTER — Ambulatory Visit: Payer: Medicaid Other | Attending: Internal Medicine

## 2020-03-03 ENCOUNTER — Other Ambulatory Visit: Payer: Self-pay

## 2020-03-03 ENCOUNTER — Encounter (HOSPITAL_COMMUNITY)
Admission: RE | Admit: 2020-03-03 | Discharge: 2020-03-03 | Disposition: A | Discharge status: Home / Self Care | Payer: Medicaid Other | Source: Ambulatory Visit | Attending: Cardiovascular Disease | Admitting: Cardiovascular Disease

## 2020-03-03 VITALS — Ht 62.25 in | Wt 326.7 lb

## 2020-03-03 DIAGNOSIS — I2119 ST elevation (STEMI) myocardial infarction involving other coronary artery of inferior wall: Secondary | ICD-10-CM | POA: Diagnosis not present

## 2020-03-03 DIAGNOSIS — Z955 Presence of coronary angioplasty implant and graft: Secondary | ICD-10-CM | POA: Insufficient documentation

## 2020-03-03 DIAGNOSIS — Z23 Encounter for immunization: Secondary | ICD-10-CM

## 2020-03-03 NOTE — Progress Notes (Signed)
   Covid-19 Vaccination Clinic  Name:  Kristin Reilly    MRN: [id number] DOB: 20-Dec-1965  03/03/2020  Ms. Kristin Reilly was observed post Covid-19 immunization for 15 minutes without incident. She was provided with Vaccine Information Sheet and instruction to access the V-Safe system.   Ms. Kristin Reilly was instructed to call 911 with any severe reactions post vaccine: Marland Kitchen Difficulty breathing  . Swelling of face and throat  . A fast heartbeat  . A bad rash all over body  . Dizziness and weakness   Immunizations Administered    Name Date Dose VIS Date Route   Pfizer COVID-19 Vaccine 03/03/2020 10:24 AM 0.3 mL 12/26/2018 Intramuscular   Manufacturer: [place]   Lot: [id number]   [place]: [id number]

## 2020-03-03 NOTE — Progress Notes (Signed)
Nutrition Note  Spoke with pt during exercise. We reviewed her progress with her diet. She is monitoring her sodium intake by reading labels. She is incorporating more veggies because she has noticed they are lower in sodium. She is using olive oil to cook rather than butter and vegetable oil. She recently made this change.  She asks about omega 3 in fish. Recommended fatty fish (salmon, tuna are her preference) twice weekly.  Her CBGs are still >130 mg/dl fasting. She reports not feeling hungry all day and eating small amounts of food (ie protein shake, apple, 1 serving almonds) and eating a really large dinner. We discussed spreading food out to prevent being overly hungry at dinner and not able to be intentional about what her body needs. We discussed education and eating intuitively. She has good knowledge of creating balanced meals. We reviewed this as well.  Recommended increased calorie intake before dinner and goal of <75 g carbs for dinner. Recommended eating until fullness and using information from dinner as a feed back loop to figure out how much she needs to eat prior to dinner to maintain a balanced dinner.  Will continue to monitor during her time in cardiac rehab.  [name], MS, RDN, LDN

## 2020-03-05 ENCOUNTER — Encounter (HOSPITAL_COMMUNITY)
Admission: RE | Admit: 2020-03-05 | Discharge: 2020-03-05 | Disposition: A | Discharge status: Home / Self Care | Payer: Medicaid Other | Source: Ambulatory Visit | Attending: Cardiovascular Disease | Admitting: Cardiovascular Disease

## 2020-03-05 ENCOUNTER — Other Ambulatory Visit: Payer: Self-pay

## 2020-03-05 DIAGNOSIS — Z955 Presence of coronary angioplasty implant and graft: Secondary | ICD-10-CM

## 2020-03-05 DIAGNOSIS — I2119 ST elevation (STEMI) myocardial infarction involving other coronary artery of inferior wall: Secondary | ICD-10-CM

## 2020-03-05 NOTE — Progress Notes (Signed)
Cardiac Individual Treatment Plan  Patient Details  Name: Kristin Reilly MRN: [id number] Date of Birth: Aug 08, 1966 Referring Provider:     CARDIAC REHAB PHASE II ORIENTATION from 01/17/2020 in [facility]  Referring Provider  Lauree Chandler MD      Initial Encounter Date:    CARDIAC REHAB PHASE II ORIENTATION from 01/17/2020 in [facility]  Date  01/17/20      Visit Diagnosis: Acute ST elevation myocardial infarction (STEMI) of inferior wall [facility])  Status post coronary artery stent placement  Patient's Home Medications on Admission:  Current Outpatient Medications:  .  ACCU-CHEK SOFTCLIX LANCETS lancets, 1 each by Misc.(Non-Drug; Combo Route) route daily., Disp: , Rfl:  .  amitriptyline (ELAVIL) 25 MG tablet, Take 25 mg by mouth at bedtime as needed for sleep. , Disp: , Rfl:  .  aspirin 81 MG chewable tablet, Chew 1 tablet (81 mg total) by mouth daily., Disp:  , Rfl:  .  atorvastatin (LIPITOR) 80 MG tablet, Take 1 tablet (80 mg total) by mouth daily at 6 PM. (Patient taking differently: Take 80 mg by mouth every morning. ), Disp: 30 tablet, Rfl: 11 .  Blood Glucose Monitoring Suppl (GLUCOCOM BLOOD GLUCOSE MONITOR) DEVI, 1 each by Misc.(Non-Drug; Combo Route) route daily. Include strips. Lancets, lancet device, control solution. batteries, Disp: , Rfl:  .  celecoxib (CELEBREX) 200 MG capsule, Take 200 mg by mouth daily. , Disp: , Rfl:  .  cholecalciferol (VITAMIN D3) 25 MCG (1000 UT) tablet, Take 1,000 Units by mouth daily., Disp: , Rfl:  .  glucose blood test strip, 1 each by Misc.(Non-Drug; Combo Route) route daily., Disp: , Rfl:  .  metFORMIN (GLUCOPHAGE-XR) 500 MG 24 hr tablet, Take 1,000 mg by mouth 2 (two) times daily with a meal. , Disp: , Rfl:  .  metoprolol tartrate (LOPRESSOR) 25 MG tablet, Take 1 tablet (25 mg total) by mouth 2 (two) times daily., Disp: 60 tablet, Rfl: 11 .  nitroGLYCERIN (NITROSTAT) 0.4  MG SL tablet, Place 1 tablet (0.4 mg total) under the tongue every 5 (five) minutes x 3 doses as needed for chest pain., Disp: 25 tablet, Rfl: 12 .  NON FORMULARY, Place 1 drop into both eyes daily., Disp: , Rfl:  .  nystatin (MYCOSTATIN/NYSTOP) powder, Apply 1 Bottle topically daily as needed (irritation). , Disp: , Rfl:  .  ticagrelor (BRILINTA) 90 MG TABS tablet, Take 1 tablet (90 mg total) by mouth 2 (two) times daily., Disp: 60 tablet, Rfl: 11 .  valsartan (DIOVAN) 80 MG tablet, Take 80 mg by mouth daily. , Disp: , Rfl:  .  vitamin C (ASCORBIC ACID) 500 MG tablet, Take 500 mg by mouth daily., Disp: , Rfl:   Past Medical History: Past Medical History:  Diagnosis Date  . Arthritis   . Depression   . Glaucoma   . Hyperlipidemia   . Hypertension   . Sleep apnea    no cpap    Tobacco Use: Social History   Tobacco Use  Smoking Status Former Smoker  . Quit date: 07/21/2005  . Years since quitting: 14.6  Smokeless Tobacco Never Used  Tobacco Comment   quit 2008    Labs: Recent Review Flowsheet Data    Labs for ITP Cardiac and Pulmonary Rehab Latest Ref Rng & Units 12/13/2019   Cholestrol 0 - 200 mg/dL 203(H)   LDLCALC 0 - 99 mg/dL 120(H)   HDL >40 mg/dL 46  Trlycerides <150 mg/dL 186(H)   Hemoglobin A1c 4.8 - 5.6 % 7.5(H)      Capillary Blood Glucose: Lab Results  Component Value Date   GLUCAP 198 (H) 02/06/2020   GLUCAP 160 (H) 02/01/2020   GLUCAP 189 (H) 02/01/2020   GLUCAP 184 (H) 01/30/2020   GLUCAP 203 (H) 01/30/2020   POCT Glucose    Row Name 01/30/20 1025             POCT Blood Glucose   Pre-Exercise  203 mg/dL       Post-Exercise  184 mg/dL          Exercise Target Goals: Exercise Program Goal: Individual exercise prescription set using results from initial 6 min walk test and THRR while considering  patient's activity barriers and safety.   Exercise Prescription Goal: Starting with aerobic activity 30 plus minutes a day, 3 days per week for  initial exercise prescription. Provide home exercise prescription and guidelines that participant acknowledges understanding prior to discharge.  Activity Barriers & Risk Stratification: Activity Barriers & Cardiac Risk Stratification - 01/17/20 1029      Activity Barriers & Cardiac Risk Stratification   Activity Barriers  Arthritis;Joint Problems;Deconditioning    Cardiac Risk Stratification  High       6 Minute Walk: 6 Minute Walk    Row Name 01/17/20 1026         6 Minute Walk   Phase  Initial     Distance  1234 feet     Walk Time  6 minutes     # of Rest Breaks  0     MPH  2.34     METS  2.12     RPE  12     Perceived Dyspnea   2     VO2 Peak  7.4     Symptoms  Yes (comment)     Comments  SOB +2     Resting HR  92 bpm     Resting BP  122/78     Resting Oxygen Saturation   100 %     Exercise Oxygen Saturation  during 6 min walk  97 %     Max Ex. HR  120 bpm     Max Ex. BP  130/82     2 Minute Post BP  112/60        Oxygen Initial Assessment:   Oxygen Re-Evaluation:   Oxygen Discharge (Final Oxygen Re-Evaluation):   Initial Exercise Prescription: Initial Exercise Prescription - 01/17/20 1000      Date of Initial Exercise RX and Referring Provider   Date  01/17/20    Referring Provider  Lauree Chandler MD    Expected Discharge Date  03/14/20      Treadmill   MPH  1.4    Grade  0    Minutes  15    METs  2.07      NuStep   Level  2    SPM  85    Minutes  15    METs  2      Prescription Details   Frequency (times per week)  3x    Duration  Progress to 30 minutes of continuous aerobic without signs/symptoms of physical distress      Intensity   THRR 40-80% of Max Heartrate  66-132    Ratings of Perceived Exertion  11-13    Perceived Dyspnea  0-4      Progression   Progression  Continue  progressive overload as per policy without signs/symptoms or physical distress.      Resistance Training   Training Prescription  Yes    Weight  2lbs     Reps  10-15       Perform Capillary Blood Glucose checks as needed.  Exercise Prescription Changes:  Exercise Prescription Changes    Row Name 01/30/20 1000 02/06/20 1418 02/18/20 1419 02/27/20 1418       Response to Exercise   Blood Pressure (Admit)  114/72  124/72  144/86  130/78    Blood Pressure (Exercise)  138/84  124/80  150/90  122/80    Blood Pressure (Exit)  134/80  144/80  124/74  120/80    Heart Rate (Admit)  96 bpm  90 bpm  96 bpm  92 bpm    Heart Rate (Exercise)  106 bpm  120 bpm  113 bpm  100 bpm    Heart Rate (Exit)  85 bpm  90 bpm  98 bpm  92 bpm    Rating of Perceived Exertion (Exercise)  12  13  12  11     Perceived Dyspnea (Exercise)  --  0  0  0    Symptoms  --  None  None  Right Knee Pain     Comments  --  Pt tolerated exercise well  Pt tolerated exercise well  None    Duration  Continue with 30 min of aerobic exercise without signs/symptoms of physical distress.  Continue with 30 min of aerobic exercise without signs/symptoms of physical distress.  Continue with 30 min of aerobic exercise without signs/symptoms of physical distress.  Continue with 30 min of aerobic exercise without signs/symptoms of physical distress.    Intensity  THRR unchanged  THRR unchanged  THRR unchanged  THRR unchanged      Progression   Progression  Continue to progress workloads to maintain intensity without signs/symptoms of physical distress.  Continue to progress workloads to maintain intensity without signs/symptoms of physical distress.  Continue to progress workloads to maintain intensity without signs/symptoms of physical distress.  Continue to progress workloads to maintain intensity without signs/symptoms of physical distress.    Average METs  --  2.2  2.2  1.4      Resistance Training   Training Prescription  --  No  Yes  No    Weight  --  --  2lbs  --    Reps  --  --  10-15  --    Time  --  --  10 Minutes  --      Treadmill   MPH  1.4  2  2   --    Grade  0  0  0  --     Minutes  15  15  15   --    METs  2.07  2.53  2.53  --      NuStep   Level  2  2  2  2     SPM  85  85  85  80    Minutes  15  15  15  30     METs  2  1.8  1.9  1.4       Exercise Comments:  Exercise Comments    Row Name 01/30/20 1021 03/05/20 0944         Exercise Comments  Pt tolerated her first day of exercise well.  Pt is continuing to respond well to exercise. Will work with pt to increase  workload on Nustep. Pt is not exercising at home. Will follow up with pt on a plan to incorporate exercising on her own.         Exercise Goals and Review:  Exercise Goals    Row Name 01/17/20 1029             Exercise Goals   Increase Physical Activity  Yes       Intervention  Provide advice, education, support and counseling about physical activity/exercise needs.;Develop an individualized exercise prescription for aerobic and resistive training based on initial evaluation findings, risk stratification, comorbidities and participant's personal goals.       Expected Outcomes  Short Term: Attend rehab on a regular basis to increase amount of physical activity.;Long Term: Add in home exercise to make exercise part of routine and to increase amount of physical activity.;Long Term: Exercising regularly at least 3-5 days a week.       Increase Strength and Stamina  Yes       Intervention  Provide advice, education, support and counseling about physical activity/exercise needs.;Develop an individualized exercise prescription for aerobic and resistive training based on initial evaluation findings, risk stratification, comorbidities and participant's personal goals.       Expected Outcomes  Short Term: Increase workloads from initial exercise prescription for resistance, speed, and METs.;Short Term: Perform resistance training exercises routinely during rehab and add in resistance training at home;Long Term: Improve cardiorespiratory fitness, muscular endurance and strength as measured by  increased METs and functional capacity (6MWT)       Able to understand and use rate of perceived exertion (RPE) scale  Yes       Intervention  Provide education and explanation on how to use RPE scale       Expected Outcomes  Short Term: Able to use RPE daily in rehab to express subjective intensity level;Long Term:  Able to use RPE to guide intensity level when exercising independently       Knowledge and understanding of Target Heart Rate Range (THRR)  Yes       Intervention  Provide education and explanation of THRR including how the numbers were predicted and where they are located for reference       Expected Outcomes  Short Term: Able to state/look up THRR;Long Term: Able to use THRR to govern intensity when exercising independently;Short Term: Able to use daily as guideline for intensity in rehab       Able to check pulse independently  Yes       Intervention  Provide education and demonstration on how to check pulse in carotid and radial arteries.;Review the importance of being able to check your own pulse for safety during independent exercise       Expected Outcomes  Short Term: Able to explain why pulse checking is important during independent exercise;Long Term: Able to check pulse independently and accurately       Understanding of Exercise Prescription  Yes       Intervention  Provide education, explanation, and written materials on patient's individual exercise prescription       Expected Outcomes  Short Term: Able to explain program exercise prescription;Long Term: Able to explain home exercise prescription to exercise independently          Exercise Goals Re-Evaluation :    Discharge Exercise Prescription (Final Exercise Prescription Changes): Exercise Prescription Changes - 02/27/20 1418      Response to Exercise   Blood Pressure (Admit)  130/78  Blood Pressure (Exercise)  122/80    Blood Pressure (Exit)  120/80    Heart Rate (Admit)  92 bpm    Heart Rate (Exercise)   100 bpm    Heart Rate (Exit)  92 bpm    Rating of Perceived Exertion (Exercise)  11    Perceived Dyspnea (Exercise)  0    Symptoms  Right Knee Pain     Comments  None    Duration  Continue with 30 min of aerobic exercise without signs/symptoms of physical distress.    Intensity  THRR unchanged      Progression   Progression  Continue to progress workloads to maintain intensity without signs/symptoms of physical distress.    Average METs  1.4      Resistance Training   Training Prescription  No      NuStep   Level  2    SPM  80    Minutes  30    METs  1.4       Nutrition:  Target Goals: Understanding of nutrition guidelines, daily intake of sodium 1500mg , cholesterol 200mg , calories 30% from fat and 7% or less from saturated fats, daily to have 5 or more servings of fruits and vegetables.  Biometrics: Pre Biometrics - 01/17/20 1027      Pre Biometrics   Height  5' 2.25" (1.581 m)    Weight  (!) 157.2 kg    Waist Circumference  59 inches    Hip Circumference  60 inches    Waist to Hip Ratio  0.98 %    BMI (Calculated)  62.89    Triceps Skinfold  44 mm    % Body Fat  65.1 %    Grip Strength  38 kg    Flexibility  12 in    Single Leg Stand  5.87 seconds        Nutrition Therapy Plan and Nutrition Goals: Nutrition Therapy & Goals - 02/08/20 1009      Nutrition Therapy   Diet  Heart Healthy/Carb modified      Personal Nutrition Goals   Nutrition Goal  Improved blood glucose control as evidenced by pt's A1c trending from 7.5 toward less than 7.0.    Personal Goal #2  CBG concentrations in the normal range or as close to normal as is safely possible.    Personal Goal #3  Pt to build a healthy plate including vegetables, fruits, whole grains, and low-fat dairy products in a heart healthy meal plan.      Intervention Plan   Intervention  Prescribe, educate and counsel regarding individualized specific dietary modifications aiming towards targeted core components such  as weight, hypertension, lipid management, diabetes, heart failure and other comorbidities.;Nutrition handout(s) given to patient.    Expected Outcomes  Short Term Goal: A plan has been developed with personal nutrition goals set during dietitian appointment.;Long Term Goal: Adherence to prescribed nutrition plan.       Nutrition Assessments: Nutrition Assessments - 02/08/20 1010      MEDFICTS Scores   Pre Score  73       Nutrition Goals Re-Evaluation: Nutrition Goals Re-Evaluation    Row Name 02/08/20 1010 03/04/20 0712           Goals   Current Weight  328 lb (148.8 kg)  326 lb 11.6 oz (148.2 kg)      Nutrition Goal  Improved blood glucose control as evidenced by pt's A1c trending from 7.5 toward less than 7.0.  Improved  blood glucose control as evidenced by pt's A1c trending from 7.5 toward less than 7.0.        Personal Goal #2 Re-Evaluation   Personal Goal #2  CBG concentrations in the normal range or as close to normal as is safely possible.  CBG concentrations in the normal range or as close to normal as is safely possible.        Personal Goal #3 Re-Evaluation   Personal Goal #3  Pt to build a healthy plate including vegetables, fruits, whole grains, and low-fat dairy products in a heart healthy meal plan.  Pt to build a healthy plate including vegetables, fruits, whole grains, and low-fat dairy products in a heart healthy meal plan.         Nutrition Goals Discharge (Final Nutrition Goals Re-Evaluation): Nutrition Goals Re-Evaluation - 03/04/20 [id number]      Goals   Current Weight  326 lb 11.6 oz (148.2 kg)    Nutrition Goal  Improved blood glucose control as evidenced by pt's A1c trending from 7.5 toward less than 7.0.      Personal Goal #2 Re-Evaluation   Personal Goal #2  CBG concentrations in the normal range or as close to normal as is safely possible.      Personal Goal #3 Re-Evaluation   Personal Goal #3  Pt to build a healthy plate including vegetables, fruits,  whole grains, and low-fat dairy products in a heart healthy meal plan.       Psychosocial: Target Goals: Acknowledge presence or absence of significant depression and/or stress, maximize coping skills, provide positive support system. Participant is able to verbalize types and ability to use techniques and skills needed for reducing stress and depression.  Initial Review & Psychosocial Screening: Initial Psych Review & Screening - 01/17/20 1052      Initial Review   Current issues with  Current Stress Concerns    Source of Stress Concerns  Chronic Illness;Occupation;Financial;Family    Comments  Kristin Reilly has some concerns related to finances, her health, and family.  She has struggled with her weight since she was a child and is currently on disability.      Family Dynamics   Good Support System?  Yes   Kristin Reilly states she has her son and friends for support.     Barriers   Psychosocial barriers to participate in program  There are no identifiable barriers or psychosocial needs.;The patient should benefit from training in stress management and relaxation.      Screening Interventions   Interventions  To provide support and resources with identified psychosocial needs;Provide feedback about the scores to participant;Encouraged to exercise    Expected Outcomes  Short Term goal: Utilizing psychosocial counselor, staff and physician to assist with identification of specific Stressors or current issues interfering with healing process. Setting desired goal for each stressor or current issue identified.;Short Term goal: Identification and review with participant of any Quality of Life or Depression concerns found by scoring the questionnaire.;Long Term Goal: Stressors or current issues are controlled or eliminated.;Long Term goal: The participant improves quality of Life and PHQ9 Scores as seen by post scores and/or verbalization of changes       Quality of Life Scores: Quality of Life -  01/17/20 1029      Quality of Life   Select  Quality of Life      Quality of Life Scores   Health/Function Pre  14.73 %    Socioeconomic Pre  18.44 %  Psych/Spiritual Pre  16.93 %    Family Pre  20.2 %    GLOBAL Pre  16.8 %      Scores of 19 and below usually indicate a poorer quality of life in these areas.  A difference of  2-3 points is a clinically meaningful difference.  A difference of 2-3 points in the total score of the Quality of Life Index has been associated with significant improvement in overall quality of life, self-image, physical symptoms, and general health in studies assessing change in quality of life.  PHQ-9: Recent Review Flowsheet Data    Depression screen [facility] 2/9 01/30/2020 01/17/2020   Decreased Interest 0 -   Down, Depressed, Hopeless 1 0   PHQ - 2 Score 1 0     Interpretation of Total Score  Total Score Depression Severity:  1-4 = Minimal depression, 5-9 = Mild depression, 10-14 = Moderate depression, 15-19 = Moderately severe depression, 20-27 = Severe depression   Psychosocial Evaluation and Intervention: Psychosocial Evaluation - 01/30/20 1706      Psychosocial Evaluation & Interventions   Interventions  Stress management education;Encouraged to exercise with the program and follow exercise prescription;Relaxation education    Comments  Kristin Reilly has stress concerns related to her chronic health issues, family, and finances.    Expected Outcomes  Kristin Reilly will report ability to use healthy coping skills to manage her stress.    Continue Psychosocial Services   Follow up required by staff       Psychosocial Re-Evaluation: Psychosocial Re-Evaluation    [place] Name 02/07/20 1429 03/05/20 1626           Psychosocial Re-Evaluation   Current issues with  Current Stress Concerns  Current Stress Concerns      Comments  Kristin Reilly reports stress related to her health and financial situations.  She denies any psychosocial interventions at this time.  Kristin Reilly  reports stress related to her health and financial situations.  She denies any psychosocial interventions at this time.      Expected Outcomes  Kristin Reilly will report ability to manage her stress.  Kristin Reilly will report ability to manage her stress.      Interventions  Encouraged to attend Cardiac Rehabilitation for the exercise  Encouraged to attend Cardiac Rehabilitation for the exercise      Continue Psychosocial Services   Follow up required by staff  Follow up required by staff      Comments  --  Kristin Reilly has some concerns related to finances, her health, and family.  She has struggled with her weight since she was a child and is currently on disability.        Initial Review   Source of Stress Concerns  --  Chronic Illness;Occupation;Financial;Family         Psychosocial Discharge (Final Psychosocial Re-Evaluation): Psychosocial Re-Evaluation - 03/05/20 1626      Psychosocial Re-Evaluation   Current issues with  Current Stress Concerns    Comments  Kristin Reilly reports stress related to her health and financial situations.  She denies any psychosocial interventions at this time.    Expected Outcomes  Kristin Reilly will report ability to manage her stress.    Interventions  Encouraged to attend Cardiac Rehabilitation for the exercise    Continue Psychosocial Services   Follow up required by staff    Comments  Kristin Reilly has some concerns related to finances, her health, and family.  She has struggled with her weight since she was a child and is currently on  disability.      Initial Review   Source of Stress Concerns  Chronic Illness;Occupation;Financial;Family       Vocational Rehabilitation: Provide vocational rehab assistance to qualifying candidates.   Vocational Rehab Evaluation & Intervention: Vocational Rehab - 03/05/20 1628      Initial Vocational Rehab Evaluation & Intervention   Assessment shows need for Vocational Rehabilitation  Yes    Vocational Rehab Packet given to patient  03/05/20     Documents faxed to [facility] Dept of Vocational Rehabilitation  03/05/20       Education: Education Goals: Education classes will be provided on a weekly basis, covering required topics. Participant will state understanding/return demonstration of topics presented.  Learning Barriers/Preferences: Learning Barriers/Preferences - 01/17/20 1030      Learning Barriers/Preferences   Learning Barriers  None    Learning Preferences  Written Material;Verbal Instruction;Skilled Demonstration       Education Topics: Hypertension, Hypertension Reduction -Define heart disease and high blood pressure. Discus how high blood pressure affects the body and ways to reduce high blood pressure.   Exercise and Your Heart -Discuss why it is important to exercise, the FITT principles of exercise, normal and abnormal responses to exercise, and how to exercise safely.   Angina -Discuss definition of angina, causes of angina, treatment of angina, and how to decrease risk of having angina.   Cardiac Medications -Review what the following cardiac medications are used for, how they affect the body, and side effects that may occur when taking the medications.  Medications include Aspirin, Beta blockers, calcium channel blockers, ACE Inhibitors, angiotensin receptor blockers, diuretics, digoxin, and antihyperlipidemics.   Congestive Heart Failure -Discuss the definition of CHF, how to live with CHF, the signs and symptoms of CHF, and how keep track of weight and sodium intake.   Heart Disease and Intimacy -Discus the effect sexual activity has on the heart, how changes occur during intimacy as we age, and safety during sexual activity.   Smoking Cessation / COPD -Discuss different methods to quit smoking, the health benefits of quitting smoking, and the definition of COPD.   Nutrition I: Fats -Discuss the types of cholesterol, what cholesterol does to the heart, and how cholesterol levels can be  controlled.   Nutrition II: Labels -Discuss the different components of food labels and how to read food label   Heart Parts/Heart Disease and PAD -Discuss the anatomy of the heart, the pathway of blood circulation through the heart, and these are affected by heart disease.   Stress I: Signs and Symptoms -Discuss the causes of stress, how stress may lead to anxiety and depression, and ways to limit stress.   Stress II: Relaxation -Discuss different types of relaxation techniques to limit stress.   Warning Signs of Stroke / TIA -Discuss definition of a stroke, what the signs and symptoms are of a stroke, and how to identify when someone is having stroke.   Knowledge Questionnaire Score: Knowledge Questionnaire Score - 01/17/20 0933      Knowledge Questionnaire Score   Pre Score  22/24       Core Components/Risk Factors/Patient Goals at Admission: Personal Goals and Risk Factors at Admission - 01/17/20 1030      Core Components/Risk Factors/Patient Goals on Admission   Diabetes  Yes    Intervention  Provide education about signs/symptoms and action to take for hypo/hyperglycemia.;Provide education about proper nutrition, including hydration, and aerobic/resistive exercise prescription along with prescribed medications to achieve blood glucose in normal ranges: Fasting  glucose 65-99 mg/dL    Expected Outcomes  Short Term: Participant verbalizes understanding of the signs/symptoms and immediate care of hyper/hypoglycemia, proper foot care and importance of medication, aerobic/resistive exercise and nutrition plan for blood glucose control.;Long Term: Attainment of HbA1C < 7%.    Hypertension  Yes    Intervention  Provide education on lifestyle modifcations including regular physical activity/exercise, weight management, moderate sodium restriction and increased consumption of fresh fruit, vegetables, and low fat dairy, alcohol moderation, and smoking cessation.;Monitor prescription  use compliance.    Expected Outcomes  Long Term: Maintenance of blood pressure at goal levels.;Short Term: Continued assessment and intervention until BP is < 140/2mm HG in hypertensive participants. < 130/66mm HG in hypertensive participants with diabetes, heart failure or chronic kidney disease.    Lipids  Yes    Intervention  Provide education and support for participant on nutrition & aerobic/resistive exercise along with prescribed medications to achieve LDL 70mg , HDL >40mg .    Expected Outcomes  Short Term: Participant states understanding of desired cholesterol values and is compliant with medications prescribed. Participant is following exercise prescription and nutrition guidelines.;Long Term: Cholesterol controlled with medications as prescribed, with individualized exercise RX and with personalized nutrition plan. Value goals: LDL < 70mg , HDL > 40 mg.       Core Components/Risk Factors/Patient Goals Review:  Goals and Risk Factor Review    Row Name 01/30/20 1708 03/05/20 1626 03/05/20 1647         Core Components/Risk Factors/Patient Goals Review   Personal Goals Review  Diabetes;Hypertension;Lipids  Diabetes;Hypertension;Lipids  Diabetes;Hypertension;Lipids     Review  Pt with multiple CAD RFs willing to participate in CR exercise.  Kristin Reilly would like to improve her heart health and decrease her risk of another CV event.  Pt with multiple CAD RFs willing to participate in CR exercise.  Kristin Reilly has been doing well with exercise. Michele's vital signs have been stable.  Pt with multiple CAD RFs willing to participate in CR exercise.  Kristin Reilly has been doing well with exercise. Michele's vital signs and CBG's have been stable.     Expected Outcomes  Kristin Reilly will continue to participate in CR exercise to decrease her risk of another CV event.  Kristin Reilly will continue to participate in CR exercise to decrease her risk of another CV event.  Kristin Reilly will continue to participate in CR exercise to  decrease her risk of another CV event.        Core Components/Risk Factors/Patient Goals at Discharge (Final Review):  Goals and Risk Factor Review - 03/05/20 1647      Core Components/Risk Factors/Patient Goals Review   Personal Goals Review  Diabetes;Hypertension;Lipids    Review  Pt with multiple CAD RFs willing to participate in CR exercise.  Kristin Reilly has been doing well with exercise. Michele's vital signs and CBG's have been stable.    Expected Outcomes  Kristin Reilly will continue to participate in CR exercise to decrease her risk of another CV event.       ITP Comments: ITP Comments    Row Name 01/17/20 0929 01/30/20 1705 03/05/20 1623       ITP Comments  Dr. Fransico Him, Medical Director  30 Day ITP Review. Kristin Reilly started exercise today and tolerated it well.  30 Day ITP Review. Kristin Reilly has fair attendance and good participation in phase 2 cardiac rehab.        Comments: See ITP comments. Kristin Reilly has been referred to vocational rehab for job retraining as she interested  in finding a job.Barnet Pall, RN,BSN 03/06/2020 12:20 PM

## 2020-03-07 ENCOUNTER — Encounter (HOSPITAL_COMMUNITY)
Admission: RE | Admit: 2020-03-07 | Discharge: 2020-03-07 | Disposition: A | Discharge status: Home / Self Care | Payer: Medicaid Other | Source: Ambulatory Visit | Attending: Cardiovascular Disease | Admitting: Cardiovascular Disease

## 2020-03-07 ENCOUNTER — Other Ambulatory Visit: Payer: Self-pay

## 2020-03-07 DIAGNOSIS — I2119 ST elevation (STEMI) myocardial infarction involving other coronary artery of inferior wall: Secondary | ICD-10-CM

## 2020-03-07 DIAGNOSIS — Z955 Presence of coronary angioplasty implant and graft: Secondary | ICD-10-CM

## 2020-03-10 ENCOUNTER — Encounter (HOSPITAL_COMMUNITY): Payer: Medicaid Other

## 2020-03-12 ENCOUNTER — Encounter (HOSPITAL_COMMUNITY): Payer: Medicaid Other

## 2020-03-14 ENCOUNTER — Encounter (HOSPITAL_COMMUNITY): Payer: Medicaid Other

## 2020-03-17 ENCOUNTER — Telehealth (HOSPITAL_COMMUNITY): Payer: Self-pay | Admitting: *Deleted

## 2020-03-17 ENCOUNTER — Encounter (HOSPITAL_COMMUNITY): Payer: Medicaid Other

## 2020-03-17 NOTE — Telephone Encounter (Signed)
Left message to call cardiac rehab about attendance in the program.Alda Gaultney Venetia Maxon, RN,BSN 03/17/2020 9:19 AM

## 2020-03-19 ENCOUNTER — Other Ambulatory Visit: Payer: Self-pay

## 2020-03-19 ENCOUNTER — Encounter (HOSPITAL_COMMUNITY)
Admission: RE | Admit: 2020-03-19 | Discharge: 2020-03-19 | Disposition: A | Discharge status: Home / Self Care | Payer: Medicaid Other | Source: Ambulatory Visit | Attending: Cardiovascular Disease | Admitting: Cardiovascular Disease

## 2020-03-19 VITALS — Ht 65.25 in | Wt 331.8 lb

## 2020-03-19 DIAGNOSIS — Z955 Presence of coronary angioplasty implant and graft: Secondary | ICD-10-CM

## 2020-03-19 DIAGNOSIS — I2119 ST elevation (STEMI) myocardial infarction involving other coronary artery of inferior wall: Secondary | ICD-10-CM

## 2020-03-19 NOTE — Progress Notes (Addendum)
Discharge Progress Report  Patient Details  Name: Kristin Reilly MRN: [id number] Date of Birth: 02-21-1966 Referring Provider:     CARDIAC REHAB PHASE II ORIENTATION from 01/17/2020 in [facility]  Referring Provider  Lauree Chandler MD       Number of Visits: 13  Reason for Discharge:  Patient reached a stable level of exercise.  Smoking History:  Social History   Tobacco Use  Smoking Status Former Smoker  . Quit date: 07/21/2005  . Years since quitting: 14.7  Smokeless Tobacco Never Used  Tobacco Comment   quit 2008    Diagnosis:  Acute ST elevation myocardial infarction (STEMI) of inferior wall (HCC)  Status post coronary artery stent placement  ADL [facility]:   Initial Exercise Prescription: Initial Exercise Prescription - 01/17/20 1000      Date of Initial Exercise RX and Referring Provider   Date  01/17/20    Referring Provider  Lauree Chandler MD    Expected Discharge Date  03/14/20      Treadmill   MPH  1.4    Grade  0    Minutes  15    METs  2.07      NuStep   Level  2    SPM  85    Minutes  15    METs  2      Prescription Details   Frequency (times per week)  3x    Duration  Progress to 30 minutes of continuous aerobic without signs/symptoms of physical distress      Intensity   THRR 40-80% of Max Heartrate  66-132    Ratings of Perceived Exertion  11-13    Perceived Dyspnea  0-4      Progression   Progression  Continue progressive overload as per policy without signs/symptoms or physical distress.      Resistance Training   Training Prescription  Yes    Weight  2lbs    Reps  10-15       Discharge Exercise Prescription (Final Exercise Prescription Changes): Exercise Prescription Changes - 03/19/20 1338      Response to Exercise   Blood Pressure (Admit)  130/78  (Pended)     Blood Pressure (Exercise)  122/80  (Pended)     Blood Pressure (Exit)  120/80  (Pended)     Heart Rate (Admit)  92  bpm  (Pended)     Heart Rate (Exercise)  100 bpm  (Pended)     Heart Rate (Exit)  92 bpm  (Pended)     Rating of Perceived Exertion (Exercise)  11  (Pended)     Perceived Dyspnea (Exercise)  0  (Pended)     Symptoms  Right Knee Pain   (Pended)     Comments  None  (Pended)     Duration  Continue with 30 min of aerobic exercise without signs/symptoms of physical distress.  (Pended)     Intensity  THRR unchanged  (Pended)       Progression   Progression  Continue to progress workloads to maintain intensity without signs/symptoms of physical distress.  (Pended)     Average METs  1.4  (Pended)       Resistance Training   Training Prescription  No  (Pended)       NuStep   Level  2  (Pended)     SPM  80  (Pended)     Minutes  30  (Pended)     METs  1.4  (Pended)        Functional Capacity: 6 Minute Walk    Row Name 01/17/20 1026 03/27/20 1502       6 Minute Walk   Phase  Initial  Discharge    Distance  1234 feet  1430 feet    Distance % Change  --  15.88 %    Distance Feet Change  --  196 ft    Walk Time  6 minutes  6 minutes    # of Rest Breaks  0  0    MPH  2.34  2.71    METS  2.12  3    RPE  12  13    Perceived Dyspnea   2  2    VO2 Peak  7.4  10.4    Symptoms  Yes (comment)  Yes (comment)    Comments  SOB +2  SOB +2    Resting HR  92 bpm  96 bpm    Resting BP  122/78  132/86    Resting Oxygen Saturation   100 %  --    Exercise Oxygen Saturation  during 6 min walk  97 %  --    Max Ex. HR  120 bpm  125 bpm    Max Ex. BP  130/82  164/74    2 Minute Post BP  112/60  120/80       Psychological, QOL, Others - Outcomes: PHQ 2/9: Depression screen [facility] 2/9 03/19/2020 01/30/2020 01/17/2020  Decreased Interest 0 0 -  Down, Depressed, Hopeless 0 1 0  PHQ - 2 Score 0 1 0    Quality of Life: Quality of Life - 03/27/20 1518      Quality of Life Scores   Health/Function Pre  14.73 %    Health/Function Post  14.93 %    Health/Function % Change  1.36 %    Socioeconomic  Pre  18.44 %    Socioeconomic Post  16.21 %    Socioeconomic % Change   -12.09 %    Psych/Spiritual Pre  16.93 %    Psych/Spiritual Post  16.14 %    Psych/Spiritual % Change  -4.67 %    Family Pre  20.2 %    Family Post  14.6 %    Family % Change  -27.72 %    GLOBAL Pre  16.8 %    GLOBAL Post  15.4 %    GLOBAL % Change  -8.33 %       Personal Goals: Goals established at orientation with interventions provided to work toward goal. Personal Goals and Risk Factors at Admission - 01/17/20 1030      Core Components/Risk Factors/Patient Goals on Admission   Diabetes  Yes    Intervention  Provide education about signs/symptoms and action to take for hypo/hyperglycemia.;Provide education about proper nutrition, including hydration, and aerobic/resistive exercise prescription along with prescribed medications to achieve blood glucose in normal ranges: Fasting glucose 65-99 mg/dL    Expected Outcomes  Short Term: Participant verbalizes understanding of the signs/symptoms and immediate care of hyper/hypoglycemia, proper foot care and importance of medication, aerobic/resistive exercise and nutrition plan for blood glucose control.;Long Term: Attainment of HbA1C < 7%.    Hypertension  Yes    Intervention  Provide education on lifestyle modifcations including regular physical activity/exercise, weight management, moderate sodium restriction and increased consumption of fresh fruit, vegetables, and low fat dairy, alcohol moderation, and smoking cessation.;Monitor prescription use compliance.    Expected  Outcomes  Long Term: Maintenance of blood pressure at goal levels.;Short Term: Continued assessment and intervention until BP is < 140/66m HG in hypertensive participants. < 130/8110mHG in hypertensive participants with diabetes, heart failure or chronic kidney disease.    Lipids  Yes    Intervention  Provide education and support for participant on nutrition & aerobic/resistive exercise along with  prescribed medications to achieve LDL <7041mHDL >85m32m  Expected Outcomes  Short Term: Participant states understanding of desired cholesterol values and is compliant with medications prescribed. Participant is following exercise prescription and nutrition guidelines.;Long Term: Cholesterol controlled with medications as prescribed, with individualized exercise RX and with personalized nutrition plan. Value goals: LDL < 70mg7mL > 40 mg.        Personal Goals Discharge: Goals and Risk Factor Review    Row Name 01/30/20 1708 03/05/20 1626 03/05/20 1647 03/19/20 0957       Core Components/Risk Factors/Patient Goals Review   Personal Goals Review  Diabetes;Hypertension;Lipids  Diabetes;Hypertension;Lipids  Diabetes;Hypertension;Lipids  Diabetes;Hypertension;Lipids    Review  Pt with multiple CAD RFs willing to participate in CR exercise.  MicheSelinda Eond like to improve her heart health and decrease her risk of another CV event.  Pt with multiple CAD RFs willing to participate in CR exercise.  MicheSelinda Eonbeen doing well with exercise. Michele's vital signs have been stable.  Pt with multiple CAD RFs willing to participate in CR exercise.  MicheSelinda Eonbeen doing well with exercise. Michele's vital signs and CBG's have been stable.  MicheSelinda Eonompleting cardiac reahb today 03/19/20 . Michele's vital signs and CBG's have been stable.    Expected Outcomes  MicheSelinda Eon continue to participate in CR exercise to decrease her risk of another CV event.  MicheSelinda Eon continue to participate in CR exercise to decrease her risk of another CV event.  MicheSelinda Eon continue to participate in CR exercise to decrease her risk of another CV event.  MicheSelinda Eon contibnue to walk on her own after completion of phase 2 cardiac rehab. Follow diet and lifestyle modifications       Exercise Goals and Review: Exercise Goals    Row Name 01/17/20 1029             Exercise Goals   Increase Physical Activity  Yes        Intervention  Provide advice, education, support and counseling about physical activity/exercise needs.;Develop an individualized exercise prescription for aerobic and resistive training based on initial evaluation findings, risk stratification, comorbidities and participant's personal goals.       Expected Outcomes  Short Term: Attend rehab on a regular basis to increase amount of physical activity.;Long Term: Add in home exercise to make exercise part of routine and to increase amount of physical activity.;Long Term: Exercising regularly at least 3-5 days a week.       Increase Strength and Stamina  Yes       Intervention  Provide advice, education, support and counseling about physical activity/exercise needs.;Develop an individualized exercise prescription for aerobic and resistive training based on initial evaluation findings, risk stratification, comorbidities and participant's personal goals.       Expected Outcomes  Short Term: Increase workloads from initial exercise prescription for resistance, speed, and METs.;Short Term: Perform resistance training exercises routinely during rehab and add in resistance training at home;Long Term: Improve cardiorespiratory fitness, muscular endurance and strength as measured by increased METs and functional capacity (6MWT)  Able to understand and use rate of perceived exertion (RPE) scale  Yes       Intervention  Provide education and explanation on how to use RPE scale       Expected Outcomes  Short Term: Able to use RPE daily in rehab to express subjective intensity level;Long Term:  Able to use RPE to guide intensity level when exercising independently       Knowledge and understanding of Target Heart Rate Range (THRR)  Yes       Intervention  Provide education and explanation of THRR including how the numbers were predicted and where they are located for reference       Expected Outcomes  Short Term: Able to state/look up THRR;Long Term: Able to use  THRR to govern intensity when exercising independently;Short Term: Able to use daily as guideline for intensity in rehab       Able to check pulse independently  Yes       Intervention  Provide education and demonstration on how to check pulse in carotid and radial arteries.;Review the importance of being able to check your own pulse for safety during independent exercise       Expected Outcomes  Short Term: Able to explain why pulse checking is important during independent exercise;Long Term: Able to check pulse independently and accurately       Understanding of Exercise Prescription  Yes       Intervention  Provide education, explanation, and written materials on patient's individual exercise prescription       Expected Outcomes  Short Term: Able to explain program exercise prescription;Long Term: Able to explain home exercise prescription to exercise independently          Exercise Goals Re-Evaluation: Exercise Goals Re-Evaluation    Row Name 03/27/20 1515             Exercise Goal Re-Evaluation   Exercise Goals Review  Increase Physical Activity;Increase Strength and Stamina;Able to understand and use rate of perceived exertion (RPE) scale;Knowledge and understanding of Target Heart Rate Range (THRR);Able to check pulse independently;Understanding of Exercise Prescription       Comments  Pt completed 13 sessions of cardiac rehab. Pt increased cardiovascular function by 15.88%. Pt also lost 14lbs since starting cardiac rehab. Pt has made good progress while in rehab and encouraged pt to continue to exercise and make heart healthy lifestyle choices.       Expected Outcomes  Pt will work to continue to exercise 3-4 days a week for 30 minutes.          Nutrition & Weight - Outcomes: Pre Biometrics - 01/17/20 1027      Pre Biometrics   Height  5' 2.25" (1.581 m)    Weight  (!) 157.2 kg    Waist Circumference  59 inches    Hip Circumference  60 inches    Waist to Hip Ratio  0.98 %     BMI (Calculated)  62.89    Triceps Skinfold  44 mm    % Body Fat  65.1 %    Grip Strength  38 kg    Flexibility  12 in    Single Leg Stand  5.87 seconds      Post Biometrics - 03/27/20 1504       Post  Biometrics   Height  5' 5.25" (1.657 m)    Weight  (!) 150.5 kg    Waist Circumference  58 inches    Hip Circumference  60 inches    Waist to Hip Ratio  0.97 %    BMI (Calculated)  54.81    Triceps Skinfold  46 mm    % Body Fat  63.6 %    Grip Strength  37 kg    Flexibility  0 in    Single Leg Stand  1.22 seconds       Nutrition: Nutrition Therapy & Goals - 02/08/20 1009      Nutrition Therapy   Diet  Heart Healthy/Carb modified      Personal Nutrition Goals   Nutrition Goal  Improved blood glucose control as evidenced by pt's A1c trending from 7.5 toward less than 7.0.    Personal Goal #2  CBG concentrations in the normal range or as close to normal as is safely possible.    Personal Goal #3  Pt to build a healthy plate including vegetables, fruits, whole grains, and low-fat dairy products in a heart healthy meal plan.      Intervention Plan   Intervention  Prescribe, educate and counsel regarding individualized specific dietary modifications aiming towards targeted core components such as weight, hypertension, lipid management, diabetes, heart failure and other comorbidities.;Nutrition handout(s) given to patient.    Expected Outcomes  Short Term Goal: A plan has been developed with personal nutrition goals set during dietitian appointment.;Long Term Goal: Adherence to prescribed nutrition plan.       Nutrition Discharge: Nutrition Assessments - 03/21/20 1008      MEDFICTS Scores   Post Score  34       Education Questionnaire Score: Knowledge Questionnaire Score - 03/27/20 1517      Knowledge Questionnaire Score   Post Score  22/24       Goals reviewed with patient; copy given to patient. Pt graduated from cardiac rehab program on 03/19/20 with completion of  13 exercise sessions in Phase II. Pt maintained fair to poor attendance and progressed nicely during his partcipation in rehab as evidenced by increased MET level.   Medication list reconciled. Repeat  PHQ score-0  .  Pt has made significant lifestyle changes and should be commended for her success. [name] increased her distance on her post exercise walk test by 196 feet and lost 6.7 kg!Pt feels she has achieved her goals during cardiac rehab.   Pt plans to continue exercise by walking. We are proud of Michele's progress and weight loss.Sharyn Lull was referred to vocational rehab for job retraining. Barnet Pall, RN,BSN 04/08/2020 12:28 PM

## 2020-03-21 ENCOUNTER — Encounter (HOSPITAL_COMMUNITY): Payer: Medicaid Other

## 2020-03-25 ENCOUNTER — Ambulatory Visit: Payer: Medicaid Other | Admitting: Cardiovascular Disease

## 2020-03-25 ENCOUNTER — Encounter: Payer: Self-pay | Admitting: Cardiovascular Disease

## 2020-03-25 ENCOUNTER — Other Ambulatory Visit: Payer: Self-pay

## 2020-03-25 DIAGNOSIS — E668 Other obesity: Secondary | ICD-10-CM | POA: Diagnosis not present

## 2020-03-25 DIAGNOSIS — I214 Non-ST elevation (NSTEMI) myocardial infarction: Secondary | ICD-10-CM | POA: Diagnosis not present

## 2020-03-25 DIAGNOSIS — I10 Essential (primary) hypertension: Secondary | ICD-10-CM

## 2020-03-25 DIAGNOSIS — G4733 Obstructive sleep apnea (adult) (pediatric): Secondary | ICD-10-CM

## 2020-03-25 DIAGNOSIS — E782 Mixed hyperlipidemia: Secondary | ICD-10-CM

## 2020-03-25 LAB — HEPATIC FUNCTION PANEL
ALT: 23 IU/L (ref 0–32)
AST: 13 IU/L (ref 0–40)
Albumin: 4.5 g/dL (ref 3.8–4.9)
Alkaline Phosphatase: 186 IU/L — ABNORMAL HIGH (ref 48–121)
Bilirubin Total: 0.5 mg/dL (ref 0.0–1.2)
Bilirubin, Direct: 0.15 mg/dL (ref 0.00–0.40)
Total Protein: 7.4 g/dL (ref 6.0–8.5)

## 2020-03-25 LAB — LIPID PANEL
Chol/HDL Ratio: 3.8 ratio (ref 0.0–4.4)
Cholesterol, Total: 158 mg/dL (ref 100–199)
HDL: 42 mg/dL (ref >39)
LDL Chol Calc (NIH): 82 mg/dL (ref 0–99)
Triglycerides: 201 mg/dL — ABNORMAL HIGH (ref 0–149)
VLDL Cholesterol Cal: 34 mg/dL (ref 5–40)

## 2020-03-25 NOTE — Addendum Note (Signed)
Addended by: Therisa Doyne on: 03/25/2020 09:00 AM   Modules accepted: Orders

## 2020-03-25 NOTE — Patient Instructions (Signed)
Medication Instructions:  Your physician recommends that you continue on your current medications as directed. Please refer to the Current Medication list given to you today.  *If you need a refill on your cardiac medications before your next appointment, please call your pharmacy*   Lab Work: Your physician recommends that you return for a FASTING lipid profile and hepatic function panel.  If you have labs (blood work) drawn today and your tests are completely normal, you will receive your results only by: Marland Kitchen MyChart Message (if you have MyChart) OR . A paper copy in the mail If you have any lab test that is abnormal or we need to change your treatment, we will call you to review the results.  Follow-Up: At [facility], you and your health needs are our priority.  As part of our continuing mission to provide you with exceptional heart care, we have created designated Provider Care Teams.  These Care Teams include your primary Cardiologist (physician) and Advanced Practice Providers (APPs -  Physician Assistants and Nurse Practitioners) who all work together to provide you with the care you need, when you need it.  We recommend signing up for the patient portal called "MyChart".  Sign up information is provided on this After Visit Summary.  MyChart is used to connect with patients for Virtual Visits (Telemedicine).  Patients are able to view lab/test results, encounter notes, upcoming appointments, etc.  Non-urgent messages can be sent to your provider as well.   To learn more about what you can do with MyChart, go to [email or web address].    Your next appointment:    3 month(s) in person with Kristin Memos, NP and 6 months in person with Dr. Gwenlyn Reilly.  Next available with Dr. Claiborne Reilly to re-establish for CPAP.    Other Instructions  Heart-Healthy Eating Plan Heart-healthy meal planning includes:  Eating less unhealthy fats.  Eating more healthy fats.  Making other changes in your  diet. Talk with your doctor or a diet specialist (dietitian) to create an eating plan that is right for you. What is my plan? Your doctor may recommend an eating plan that includes:  Total fat: ______% or less of total calories a day.  Saturated fat: ______% or less of total calories a day.  Cholesterol: less than _________mg a day. What are tips for following this plan? Cooking Avoid frying your food. Try to bake, boil, grill, or broil it instead. You can also reduce fat by:  Removing the skin from poultry.  Removing all visible fats from meats.  Steaming vegetables in water or broth. Meal planning   At meals, divide your plate into four equal parts: ? Fill one-half of your plate with vegetables and green salads. ? Fill one-fourth of your plate with whole grains. ? Fill one-fourth of your plate with lean protein foods.  Eat 4-5 servings of vegetables per day. A serving of vegetables is: ? 1 cup of raw or cooked vegetables. ? 2 cups of raw leafy greens.  Eat 4-5 servings of fruit per day. A serving of fruit is: ? 1 medium whole fruit. ?  cup of dried fruit. ?  cup of fresh, frozen, or canned fruit. ?  cup of 100% fruit juice.  Eat more foods that have soluble fiber. These are apples, broccoli, carrots, beans, peas, and barley. Try to get 20-30 g of fiber per day.  Eat 4-5 servings of nuts, legumes, and seeds per week: ? 1 serving of dried beans or legumes equals  cup after being cooked. ? 1 serving of nuts is  cup. ? 1 serving of seeds equals 1 tablespoon. General information  Eat more home-cooked food. Eat less restaurant, buffet, and fast food.  Limit or avoid alcohol.  Limit foods that are high in starch and sugar.  Avoid fried foods.  Lose weight if you are overweight.  Keep track of how much salt (sodium) you eat. This is important if you have high blood pressure. Ask your doctor to tell you more about this.  Try to add vegetarian meals each  week. Fats  Choose healthy fats. These include olive oil and canola oil, flaxseeds, walnuts, almonds, and seeds.  Eat more omega-3 fats. These include salmon, mackerel, sardines, tuna, flaxseed oil, and ground flaxseeds. Try to eat fish at least 2 times each week.  Check food labels. Avoid foods with trans fats or high amounts of saturated fat.  Limit saturated fats. ? These are often Reilly in animal products, such as meats, butter, and cream. ? These are also Reilly in plant foods, such as palm oil, palm kernel oil, and coconut oil.  Avoid foods with partially hydrogenated oils in them. These have trans fats. Examples are stick margarine, some tub margarines, cookies, crackers, and other baked goods. What foods can I eat? Fruits All fresh, canned (in natural juice), or frozen fruits. Vegetables Fresh or frozen vegetables (raw, steamed, roasted, or grilled). Green salads. Grains Most grains. Choose whole wheat and whole grains most of the time. Rice and pasta, including brown rice and pastas made with whole wheat. Meats and other proteins Lean, well-trimmed beef, veal, pork, and lamb. Chicken and [place] without skin. All fish and shellfish. Wild duck, rabbit, pheasant, and venison. Egg whites or low-cholesterol egg substitutes. Dried beans, peas, lentils, and tofu. Seeds and most nuts. Dairy Low-fat or nonfat cheeses, including ricotta and mozzarella. Skim or 1% milk that is liquid, powdered, or evaporated. Buttermilk that is made with low-fat milk. Nonfat or low-fat yogurt. Fats and oils Non-hydrogenated (trans-free) margarines. Vegetable oils, including soybean, sesame, sunflower, olive, peanut, safflower, corn, canola, and cottonseed. Salad dressings or mayonnaise made with a vegetable oil. Beverages Mineral water. Coffee and tea. Diet carbonated beverages. Sweets and desserts Sherbet, gelatin, and fruit ice. Small amounts of dark chocolate. Limit all sweets and desserts. Seasonings  and condiments All seasonings and condiments. The items listed above may not be a complete list of foods and drinks you can eat. Contact a dietitian for more options. What foods should I avoid? Fruits Canned fruit in heavy syrup. Fruit in cream or butter sauce. Fried fruit. Limit coconut. Vegetables Vegetables cooked in cheese, cream, or butter sauce. Fried vegetables. Grains Breads that are made with saturated or trans fats, oils, or whole milk. Croissants. Sweet rolls. Donuts. High-fat crackers, such as cheese crackers. Meats and other proteins Fatty meats, such as hot dogs, ribs, sausage, bacon, rib-eye roast or steak. High-fat deli meats, such as salami and bologna. Caviar. Domestic duck and goose. Organ meats, such as liver. Dairy Cream, sour cream, cream cheese, and creamed cottage cheese. Whole-milk cheeses. Whole or 2% milk that is liquid, evaporated, or condensed. Whole buttermilk. Cream sauce or high-fat cheese sauce. Yogurt that is made from whole milk. Fats and oils Meat fat, or shortening. Cocoa butter, hydrogenated oils, palm oil, coconut oil, palm kernel oil. Solid fats and shortenings, including bacon fat, salt pork, lard, and butter. Nondairy cream substitutes. Salad dressings with cheese or sour cream. Beverages Regular sodas and juice  drinks with added sugar. Sweets and desserts Frosting. Pudding. Cookies. Cakes. Pies. Milk chocolate or white chocolate. Buttered syrups. Full-fat ice cream or ice cream drinks. The items listed above may not be a complete list of foods and drinks to avoid. Contact a dietitian for more information. Summary  Heart-healthy meal planning includes eating less unhealthy fats, eating more healthy fats, and making other changes in your diet.  Eat a balanced diet. This includes fruits and vegetables, low-fat or nonfat dairy, lean protein, nuts and legumes, whole grains, and heart-healthy oils and fats. This information is not intended to replace  advice given to you by your health care provider. Make sure you discuss any questions you have with your health care provider. Document Revised: 12/22/2017 Document Reviewed: 11/25/2017 Elsevier Patient Education  2020 [place] DASH stands for "Dietary Approaches to Stop Hypertension." The DASH eating plan is a healthy eating plan that has been shown to reduce high blood pressure (hypertension). It may also reduce your risk for type 2 diabetes, heart disease, and stroke. The DASH eating plan may also help with weight loss. What are tips for following this plan?  General guidelines  Avoid eating more than 2,300 mg (milligrams) of salt (sodium) a day. If you have hypertension, you may need to reduce your sodium intake to 1,500 mg a day.  Limit alcohol intake to no more than 1 drink a day for nonpregnant women and 2 drinks a day for men. One drink equals 12 oz of beer, 5 oz of wine, or 1 oz of hard liquor.  Work with your health care provider to maintain a healthy body weight or to lose weight. Ask what an ideal weight is for you.  Get at least 30 minutes of exercise that causes your heart to beat faster (aerobic exercise) most days of the week. Activities may include walking, swimming, or biking.  Work with your health care provider or diet and nutrition specialist (dietitian) to adjust your eating plan to your individual calorie needs. Reading food labels   Check food labels for the amount of sodium per serving. Choose foods with less than 5 percent of the Daily Value of sodium. Generally, foods with less than 300 mg of sodium per serving fit into this eating plan.  To find whole grains, look for the word "whole" as the first word in the ingredient list. Shopping  Buy products labeled as "low-sodium" or "no salt added."  Buy fresh foods. Avoid canned foods and premade or frozen meals. Cooking  Avoid adding salt when cooking. Use salt-free seasonings or  herbs instead of table salt or sea salt. Check with your health care provider or pharmacist before using salt substitutes.  Do not fry foods. Cook foods using healthy methods such as baking, boiling, grilling, and broiling instead.  Cook with heart-healthy oils, such as olive, canola, soybean, or sunflower oil. Meal planning  Eat a balanced diet that includes: ? 5 or more servings of fruits and vegetables each day. At each meal, try to fill half of your plate with fruits and vegetables. ? Up to 6-8 servings of whole grains each day. ? Less than 6 oz of lean meat, poultry, or fish each day. A 3-oz serving of meat is about the same size as a deck of cards. One egg equals 1 oz. ? 2 servings of low-fat dairy each day. ? A serving of nuts, seeds, or beans 5 times each week. ? Heart-healthy fats. Healthy  fats called Omega-3 fatty acids are Reilly in foods such as flaxseeds and coldwater fish, like sardines, salmon, and mackerel.  Limit how much you eat of the following: ? Canned or prepackaged foods. ? Food that is high in trans fat, such as fried foods. ? Food that is high in saturated fat, such as fatty meat. ? Sweets, desserts, sugary drinks, and other foods with added sugar. ? Full-fat dairy products.  Do not salt foods before eating.  Try to eat at least 2 vegetarian meals each week.  Eat more home-cooked food and less restaurant, buffet, and fast food.  When eating at a restaurant, ask that your food be prepared with less salt or no salt, if possible. What foods are recommended? The items listed may not be a complete list. Talk with your dietitian about what dietary choices are best for you. Grains Whole-grain or whole-wheat bread. Whole-grain or whole-wheat pasta. Brown rice. [place]ena Morrow. Bulgur. Whole-grain and low-sodium cereals. Pita bread. Low-fat, low-sodium crackers. Whole-wheat flour tortillas. Vegetables Fresh or frozen vegetables (raw, steamed, roasted, or grilled).  Low-sodium or reduced-sodium tomato and vegetable juice. Low-sodium or reduced-sodium tomato sauce and tomato paste. Low-sodium or reduced-sodium canned vegetables. Fruits All fresh, dried, or frozen fruit. Canned fruit in natural juice (without added sugar). Meat and other protein foods Skinless chicken or [place]. Ground chicken or [place]. Pork with fat trimmed off. Fish and seafood. Egg whites. Dried beans, peas, or lentils. Unsalted nuts, nut butters, and seeds. Unsalted canned beans. Lean cuts of beef with fat trimmed off. Low-sodium, lean deli meat. Dairy Low-fat (1%) or fat-free (skim) milk. Fat-free, low-fat, or reduced-fat cheeses. Nonfat, low-sodium ricotta or cottage cheese. Low-fat or nonfat yogurt. Low-fat, low-sodium cheese. Fats and oils Soft margarine without trans fats. Vegetable oil. Low-fat, reduced-fat, or light mayonnaise and salad dressings (reduced-sodium). Canola, safflower, olive, soybean, and sunflower oils. Avocado. Seasoning and other foods Herbs. Spices. Seasoning mixes without salt. Unsalted popcorn and pretzels. Fat-free sweets. What foods are not recommended? The items listed may not be a complete list. Talk with your dietitian about what dietary choices are best for you. Grains Baked goods made with fat, such as croissants, muffins, or some breads. Dry pasta or rice meal packs. Vegetables Creamed or fried vegetables. Vegetables in a cheese sauce. Regular canned vegetables (not low-sodium or reduced-sodium). Regular canned tomato sauce and paste (not low-sodium or reduced-sodium). Regular tomato and vegetable juice (not low-sodium or reduced-sodium). Angie Fava. Olives. Fruits Canned fruit in a light or heavy syrup. Fried fruit. Fruit in cream or butter sauce. Meat and other protein foods Fatty cuts of meat. Ribs. Fried meat. Berniece Salines. Sausage. Bologna and other processed lunch meats. Salami. Fatback. Hotdogs. Bratwurst. Salted nuts and seeds. Canned beans with added  salt. Canned or smoked fish. Whole eggs or egg yolks. Chicken or [place] with skin. Dairy Whole or 2% milk, cream, and half-and-half. Whole or full-fat cream cheese. Whole-fat or sweetened yogurt. Full-fat cheese. Nondairy creamers. Whipped toppings. Processed cheese and cheese spreads. Fats and oils Butter. Stick margarine. Lard. Shortening. Ghee. Bacon fat. Tropical oils, such as coconut, palm kernel, or palm oil. Seasoning and other foods Salted popcorn and pretzels. Onion salt, garlic salt, seasoned salt, table salt, and sea salt. Worcestershire sauce. Tartar sauce. Barbecue sauce. Teriyaki sauce. Soy sauce, including reduced-sodium. Steak sauce. Canned and packaged gravies. Fish sauce. Oyster sauce. Cocktail sauce. Horseradish that you find on the shelf. Ketchup. Mustard. Meat flavorings and tenderizers. Bouillon cubes. Hot sauce and Tabasco sauce. Premade or packaged  marinades. Premade or packaged taco seasonings. Relishes. Regular salad dressings. Where to find more information:  National Heart, Lung, and [place]: https://wilson-eaton.com/  American Heart Association: www.heart.org Summary  The DASH eating plan is a healthy eating plan that has been shown to reduce high blood pressure (hypertension). It may also reduce your risk for type 2 diabetes, heart disease, and stroke.  With the DASH eating plan, you should limit salt (sodium) intake to 2,300 mg a day. If you have hypertension, you may need to reduce your sodium intake to 1,500 mg a day.  When on the DASH eating plan, aim to eat more fresh fruits and vegetables, whole grains, lean proteins, low-fat dairy, and heart-healthy fats.  Work with your health care provider or diet and nutrition specialist (dietitian) to adjust your eating plan to your individual calorie needs. This information is not intended to replace advice given to you by your health care provider. Make sure you discuss any questions you have with your health care  provider. Document Revised: 09/30/2017 Document Reviewed: 10/11/2016 Elsevier Patient Education  2020 [employer].

## 2020-03-25 NOTE — Progress Notes (Signed)
03/25/2020 Kristin Reilly   04-Mar-1966  [id number]  Primary Physician Berkley Harvey, NP Primary Cardiologist: Lorretta Harp MD Garret Reddish, [place], [place]  HPI:  Kristin Reilly is a 54 y.o. 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  She did see Coletta Memos, FNP a week after discharge.  Her cardiac risk factor profile is notable for discontinue tobacco abuse in 2004, treated hypertension, diabetes and hyperlipidemia.  She had an acute inferior wall myocardial infarction on 12/14/2019 with cath by Dr. Angelena Form revealing occluded mid dominant RCA which was stented with a 3 mm x 60 mm long Synergy drug-eluting stent.  She had minimal disease in other vessels.  Her EF was preserved.  She has been maintained on aspirin and Brilinta.  Her major complaints are of daytime somnolence probably related to obstructive sleep apnea.  She has been on CPAP in the past.   Current Meds  Medication Sig  . ACCU-CHEK SOFTCLIX LANCETS lancets 1 each by Misc.(Non-Drug; Combo Route) route daily.  Marland Kitchen amitriptyline (ELAVIL) 25 MG tablet Take 25 mg by mouth at bedtime as needed for sleep.   Marland Kitchen aspirin 81 MG chewable tablet Chew 1 tablet (81 mg total) by mouth daily.  Marland Kitchen atorvastatin (LIPITOR) 80 MG tablet Take 1 tablet (80 mg total) by mouth daily at 6 PM. (Patient taking differently: Take 80 mg by mouth every morning. )  . Blood Glucose Monitoring Suppl (GLUCOCOM BLOOD GLUCOSE MONITOR) DEVI 1 each by Misc.(Non-Drug; Combo Route) route daily. Include strips. Lancets, lancet device, control solution. batteries  . cholecalciferol (VITAMIN D3) 25 MCG (1000 UT) tablet Take 1,000 Units by mouth daily.  Marland Kitchen glucose blood test strip 1 each by Misc.(Non-Drug; Combo Route) route daily.  Marland Kitchen latanoprost (XALATAN) 0.005 % ophthalmic  solution 1 drop at bedtime.  . metFORMIN (GLUCOPHAGE-XR) 500 MG 24 hr tablet Take 1,000 mg by mouth 2 (two) times daily with a meal.   . metoprolol tartrate (LOPRESSOR) 25 MG tablet Take 1 tablet (25 mg total) by mouth 2 (two) times daily.  . nitroGLYCERIN (NITROSTAT) 0.4 MG SL tablet Place 1 tablet (0.4 mg total) under the tongue every 5 (five) minutes x 3 doses as needed for chest pain.  . NON FORMULARY Place 1 drop into both eyes daily.  Marland Kitchen nystatin (MYCOSTATIN/NYSTOP) powder Apply 1 Bottle topically daily as needed (irritation).   . ticagrelor (BRILINTA) 90 MG TABS tablet Take 1 tablet (90 mg total) by mouth 2 (two) times daily.  . valsartan (DIOVAN) 80 MG tablet Take 80 mg by mouth daily.   . vitamin C (ASCORBIC ACID) 500 MG tablet Take 500 mg by mouth daily.     Allergies  Allergen Reactions  . Fenofibrate Nausea And Vomiting  . Tylox [Oxycodone-Acetaminophen] Other (See Comments)    Migraine HA and GI upset    Social History   Socioeconomic History  . Marital status: Divorced    Spouse name: Not on file  . Number of children: Not on file  . Years of education: Not on file  . Highest education level: Not on file  Occupational History  . Not on file  Tobacco Use  . Smoking status: Former Smoker    Quit date: 07/21/2005    Years since quitting: 14.6  . Smokeless tobacco: Never Used  .  Tobacco comment: quit 2008  Substance and Sexual Activity  . Alcohol use: Yes    Alcohol/week: 0.0 standard drinks    Comment: occasionally  . Drug use: No  . Sexual activity: Yes    Birth control/protection: I.U.D.  Other Topics Concern  . Not on file  Social History Narrative  . Not on file   Social Determinants of Health   Financial Resource Strain:   . Difficulty of Paying Living Expenses:   Food Insecurity:   . Worried About Charity fundraiser in the Last Year:   . Arboriculturist in the Last Year:   Transportation Needs:   . Film/video editor (Medical):   Marland Kitchen Lack of  Transportation (Non-Medical):   Physical Activity:   . Days of Exercise per Week:   . Minutes of Exercise per Session:   Stress:   . Feeling of Stress :   Social Connections:   . Frequency of Communication with Friends and Family:   . Frequency of Social Gatherings with Friends and Family:   . Attends Religious Services:   . Active Member of Clubs or Organizations:   . Attends Archivist Meetings:   Marland Kitchen Marital Status:   Intimate Partner Violence:   . Fear of Current or Ex-Partner:   . Emotionally Abused:   Marland Kitchen Physically Abused:   . Sexually Abused:      Review of Systems: General: negative for chills, fever, night sweats or weight changes.  Cardiovascular: negative for chest pain, dyspnea on exertion, edema, orthopnea, palpitations, paroxysmal nocturnal dyspnea or shortness of breath Dermatological: negative for rash Respiratory: negative for cough or wheezing Urologic: negative for hematuria Abdominal: negative for nausea, vomiting, diarrhea, bright red blood per rectum, melena, or hematemesis Neurologic: negative for visual changes, syncope, or dizziness All other systems reviewed and are otherwise negative except as noted above.    Blood pressure (!) 150/84, pulse 84, height 5\' 6"  (1.676 m), weight (!) 332 lb 9.6 oz (150.9 kg), SpO2 97 %.  General appearance: alert and no distress Neck: no adenopathy, no carotid bruit, no JVD, supple, symmetrical, trachea midline and thyroid not enlarged, symmetric, no tenderness/mass/nodules Lungs: clear to auscultation bilaterally Heart: regular rate and rhythm, S1, S2 normal, no murmur, click, rub or gallop Extremities: extremities normal, atraumatic, no cyanosis or edema Pulses: 2+ and symmetric Skin: Skin color, texture, turgor normal. No rashes or lesions Neurologic: Alert and oriented X 3, normal strength and tone. Normal symmetric reflexes. Normal coordination and gait  EKG not performed today  ASSESSMENT AND PLAN:    Essential (primary) hypertension History of essential hypertension a blood pressure measured today 150/84.  I have reviewed reviewed her blood pressure log and for the most part her blood pressure is in the 130 140/80 range.  She is on metoprolol and valsartan.  Extreme obesity Extreme obesity with a BMI of 53 unchanged and less several months.  Obstructive apnea History of [place] of obstructive sleep apnea on CPAP in the past but not recently.  She will need a repeat sleep study and follow-up with Dr. Claiborne Billings.  She does have morbid obesity, nocturnal's snoring and daytime somnolence all consistent with sleep apnea.  NSTEMI (non-ST elevated myocardial infarction) [facility]) History of inferior STEMI status post PCI and drug-eluting stenting by Dr. Angelena Form 12/14/2019 with a synergy 3 mm x 60 mm long drug-eluting stent.  She had minimal disease in her other vessels with preserved LV function.  She has remained on low-dose aspirin  and Brilinta twice daily.  She has been asymptomatic since.  HLD (hyperlipidemia) History of hyperlipidemia on atorvastatin 10 mg a day prior to her coronary event.  Her lipid profile at that time on 12/13/2019 revealed a total cholesterol 203, LDL 120 HDL 46.  She has been on atorvastatin 80 mg a day since that time.  We will recheck a lipid liver profile today.      Lorretta Harp MD FACP,FACC,FAHA, [facility] 03/25/2020 8:53 AM

## 2020-03-25 NOTE — Assessment & Plan Note (Signed)
History of essential hypertension a blood pressure measured today 150/84.  I have reviewed reviewed her blood pressure log and for the most part her blood pressure is in the 130 140/80 range.  She is on metoprolol and valsartan.

## 2020-03-25 NOTE — Assessment & Plan Note (Signed)
Extreme obesity with a BMI of 53 unchanged and less several months.

## 2020-03-25 NOTE — Assessment & Plan Note (Signed)
History of inferior STEMI status post PCI and drug-eluting stenting by Dr. Angelena Form 12/14/2019 with a synergy 3 mm x 60 mm long drug-eluting stent.  She had minimal disease in her other vessels with preserved LV function.  She has remained on low-dose aspirin and Brilinta twice daily.  She has been asymptomatic since.

## 2020-03-25 NOTE — Assessment & Plan Note (Signed)
History of hyperlipidemia on atorvastatin 10 mg a day prior to her coronary event.  Her lipid profile at that time on 12/13/2019 revealed a total cholesterol 203, LDL 120 HDL 46.  She has been on atorvastatin 80 mg a day since that time.  We will recheck a lipid liver profile today.

## 2020-03-25 NOTE — Assessment & Plan Note (Signed)
History of [place] of obstructive sleep apnea on CPAP in the past but not recently.  She will need a repeat sleep study and follow-up with Dr. Claiborne Billings.  She does have morbid obesity, nocturnal's snoring and daytime somnolence all consistent with sleep apnea.

## 2020-03-27 ENCOUNTER — Other Ambulatory Visit: Payer: Self-pay | Admitting: *Deleted

## 2020-03-27 ENCOUNTER — Telehealth: Payer: Self-pay | Admitting: Cardiovascular Disease

## 2020-03-27 DIAGNOSIS — E782 Mixed hyperlipidemia: Secondary | ICD-10-CM

## 2020-03-27 MED ORDER — EZETIMIBE 10 MG PO TABS: 10.0000 mg | ORAL_TABLET | Freq: Every day | ORAL | 11 refills | Status: AC

## 2020-03-27 NOTE — Telephone Encounter (Signed)
I spoke with patient and reviewed lab results from 03/25/20 with her.  Will send prescription for Zetia to [facility] on [address] and [address].  She will come in for fasting lipid profile on July 27,2021

## 2020-03-27 NOTE — Telephone Encounter (Signed)
New message  Patient is returning call about results. Please give patient a call back. 

## 2020-05-12 ENCOUNTER — Ambulatory Visit: Payer: Medicaid Other | Admitting: Podiatry

## 2020-05-12 ENCOUNTER — Encounter: Payer: Self-pay | Admitting: Podiatry

## 2020-05-12 ENCOUNTER — Other Ambulatory Visit: Payer: Self-pay

## 2020-05-12 DIAGNOSIS — M79674 Pain in right toe(s): Secondary | ICD-10-CM

## 2020-05-12 DIAGNOSIS — E1142 Type 2 diabetes mellitus with diabetic polyneuropathy: Secondary | ICD-10-CM

## 2020-05-12 DIAGNOSIS — B351 Tinea unguium: Secondary | ICD-10-CM | POA: Diagnosis not present

## 2020-05-12 DIAGNOSIS — M79675 Pain in left toe(s): Secondary | ICD-10-CM

## 2020-05-16 NOTE — Progress Notes (Signed)
Subjective:  Patient ID: Kristin Reilly, female    DOB: May 16, 1966,  MRN: [id number]  Kristin Reilly presents to clinic today for preventative diabetic foot care and painful thick toenails that are difficult to trim. Pain interferes with ambulation. Aggravating factors include wearing enclosed shoe gear. Pain is relieved with periodic professional debridement..  54 y.o. female presents with the above complaint.   Patient states she had a heart attack in February. She is still recovering.  Also on blood thinner, Brilinta.  Review of Systems: Negative except as noted in the HPI. Past Medical History:  Diagnosis Date  . Arthritis   . Depression   . Glaucoma   . Hyperlipidemia   . Hypertension   . Sleep apnea    no cpap   Past Surgical History:  Procedure Laterality Date  . APPENDECTOMY    . COLONOSCOPY N/A 08/05/2015   Procedure: COLONOSCOPY;  Surgeon: Jerene Bears, MD;  Location: [facility];  Service: Gastroenterology;  Laterality: N/A;  . COLONOSCOPY WITH PROPOFOL N/A 01/09/2019   Procedure: COLONOSCOPY WITH PROPOFOL;  Surgeon: Jerene Bears, MD;  Location: [facility];  Service: Gastroenterology;  Laterality: N/A;  . CORONARY STENT INTERVENTION N/A 12/14/2019   Procedure: CORONARY STENT INTERVENTION;  Surgeon: Burnell Blanks, MD;  Location: [place] [facility];  Service: Cardiovascular;  Laterality: N/A;  . CORONARY/GRAFT ACUTE MI REVASCULARIZATION N/A 12/14/2019   Procedure: Coronary/Graft Acute MI Revascularization;  Surgeon: Burnell Blanks, MD;  Location: [place] [facility];  Service: Cardiovascular;  Laterality: N/A;  . LEFT HEART CATH AND CORONARY ANGIOGRAPHY N/A 12/14/2019   Procedure: LEFT HEART CATH AND CORONARY ANGIOGRAPHY;  Surgeon: Burnell Blanks, MD;  Location: [place] [facility];  Service: Cardiovascular;  Laterality: N/A;  . POLYPECTOMY  01/09/2019   Procedure: POLYPECTOMY;  Surgeon: Jerene Bears, MD;  [facility];  Service:  Gastroenterology;;  . TONSILLECTOMY    . WISDOM TOOTH EXTRACTION      Current Outpatient Medications:  .  ACCU-CHEK SOFTCLIX LANCETS lancets, 1 each by Misc.(Non-Drug; Combo Route) route daily., Disp: , Rfl:  .  amitriptyline (ELAVIL) 25 MG tablet, Take 25 mg by mouth at bedtime as needed for sleep. , Disp: , Rfl:  .  aspirin 81 MG chewable tablet, Chew 1 tablet (81 mg total) by mouth daily., Disp:  , Rfl:  .  atorvastatin (LIPITOR) 80 MG tablet, Take 1 tablet (80 mg total) by mouth daily at 6 PM. (Patient taking differently: Take 80 mg by mouth every morning. ), Disp: 30 tablet, Rfl: 11 .  Blood Glucose Monitoring Suppl (GLUCOCOM BLOOD GLUCOSE MONITOR) DEVI, 1 each by Misc.(Non-Drug; Combo Route) route daily. Include strips. Lancets, lancet device, control solution. batteries, Disp: , Rfl:  .  cholecalciferol (VITAMIN D3) 25 MCG (1000 UT) tablet, Take 1,000 Units by mouth daily., Disp: , Rfl:  .  ezetimibe (ZETIA) 10 MG tablet, Take 1 tablet (10 mg total) by mouth daily., Disp: 30 tablet, Rfl: 11 .  glucose blood test strip, 1 each by Misc.(Non-Drug; Combo Route) route daily., Disp: , Rfl:  .  hydrochlorothiazide (HYDRODIURIL) 25 MG tablet, Take 25 mg by mouth daily., Disp: , Rfl:  .  latanoprost (XALATAN) 0.005 % ophthalmic solution, 1 drop at bedtime., Disp: , Rfl:  .  metFORMIN (GLUCOPHAGE-XR) 500 MG 24 hr tablet, Take 1,000 mg by mouth 2 (two) times daily with a meal. , Disp: , Rfl:  .  metoprolol tartrate (LOPRESSOR) 25 MG tablet, Take 1 tablet (  25 mg total) by mouth 2 (two) times daily., Disp: 60 tablet, Rfl: 11 .  nitroGLYCERIN (NITROSTAT) 0.4 MG SL tablet, Place 1 tablet (0.4 mg total) under the tongue every 5 (five) minutes x 3 doses as needed for chest pain., Disp: 25 tablet, Rfl: 12 .  NON FORMULARY, Place 1 drop into both eyes daily., Disp: , Rfl:  .  nystatin (MYCOSTATIN/NYSTOP) powder, Apply 1 Bottle topically daily as needed (irritation). , Disp: , Rfl:  .  ticagrelor (BRILINTA)  90 MG TABS tablet, Take 1 tablet (90 mg total) by mouth 2 (two) times daily., Disp: 60 tablet, Rfl: 11 .  valsartan (DIOVAN) 80 MG tablet, Take 80 mg by mouth daily. , Disp: , Rfl:  .  vitamin C (ASCORBIC ACID) 500 MG tablet, Take 500 mg by mouth daily., Disp: , Rfl:  Allergies  Allergen Reactions  . Fenofibrate Nausea And Vomiting  . Tylox [Oxycodone-Acetaminophen] Other (See Comments)    Migraine HA and GI upset   Social History   Occupational History  . Not on file  Tobacco Use  . Smoking status: Former Smoker    Quit date: 07/21/2005    Years since quitting: 14.8  . Smokeless tobacco: Never Used  . Tobacco comment: quit 2008  Substance and Sexual Activity  . Alcohol use: Yes    Alcohol/week: 0.0 standard drinks    Comment: occasionally  . Drug use: No  . Sexual activity: Yes    Birth control/protection: I.U.D.    Objective:   Constitutional Kristin Reilly is a pleasant 54 y.o. Caucasian female, morbidly obese in NAD.Marland Kitchen AAO x 3.   Vascular Dorsalis pedis pulses palpable bilaterally. Posterior tibial pulses palpable bilaterally. Capillary refill normal to all digits.  No cyanosis or clubbing noted. Pedal hair growth sparse b/l.  Neurologic Normal speech. Oriented to person, place, and time. Epicritic sensation to light touch grossly present bilaterally. Protective sensation intact 5/5 intact bilaterally with 10g monofilament b/l. Vibratory sensation intact b/l. Proprioception intact bilaterally. Clonus negative b/l.  Dermatologic Pedal skin with normal turgor, texture and tone b/l.  Toenails are discolored, thick, elongated, dystrophic with pain on palpation x 10 No open wounds. No skin lesions.  Orthopedic: Normal muscle strength 5/5 to all lower extremity muscle groups bilaterally. No pain crepitus or joint limitation noted with ROM b/l. No gross bony deformities bilaterally. No bony tenderness.    Radiographs: None Assessment:   1. Pain due to onychomycosis of  toenails of both feet   2. Diabetic peripheral neuropathy associated with type 2 diabetes mellitus ([place])    Plan:  Patient was evaluated and treated and all questions answered.  Onychomycosis with pain -Nails palliatively debridement as below -Educated on self-care  Procedure: Nail Debridement Rationale: Pain Type of Debridement: manual, sharp debridement. Instrumentation: Nail nipper, rotary burr. Number of Nails: 10 -Continue diabetic foot care principles. -Toenails 1-5 b/l were debrided in length and girth with sterile nail nippers and dremel without iatrogenic bleeding.  -Patient to report any pedal injuries to medical professional immediately. -Patient to continue soft, supportive shoe gear daily. -Patient/POA to call should there be question/concern in the interim.  Return in about 3 months (around 08/12/2020) for diabetic nail trim.  Marzetta Board, DPM

## 2020-05-27 LAB — LIPID PANEL
Chol/HDL Ratio: 2.6 ratio (ref 0.0–4.4)
Cholesterol, Total: 87 mg/dL — ABNORMAL LOW (ref 100–199)
HDL: 33 mg/dL — ABNORMAL LOW (ref >39)
LDL Chol Calc (NIH): 36 mg/dL (ref 0–99)
Triglycerides: 93 mg/dL (ref 0–149)
VLDL Cholesterol Cal: 18 mg/dL (ref 5–40)

## 2020-06-17 NOTE — Progress Notes (Signed)
Cardiology Clinic Note   Patient Name: Kristin Reilly Date of Encounter: 06/20/2020  Primary Care Provider:  Berkley Harvey, NP Primary Cardiologist:  Quay Burow, MD  Patient Profile    Kristin Reilly 54 year old female presents today for a follow-up evaluation of her CAD (STEMI 2/21 with PCI and DES x 1 to her pRCA)  Past Medical History    Past Medical History:  Diagnosis Date  . Arthritis   . Depression   . Glaucoma   . Hyperlipidemia   . Hypertension   . Sleep apnea    no cpap   Past Surgical History:  Procedure Laterality Date  . APPENDECTOMY    . COLONOSCOPY N/A 08/05/2015   Procedure: COLONOSCOPY;  Surgeon: Jerene Bears, MD;  [facility];  Service: Gastroenterology;  Laterality: N/A;  . COLONOSCOPY WITH PROPOFOL N/A 01/09/2019   Procedure: COLONOSCOPY WITH PROPOFOL;  Surgeon: Jerene Bears, MD;  [facility];  Service: Gastroenterology;  Laterality: N/A;  . CORONARY STENT INTERVENTION N/A 12/14/2019   Procedure: CORONARY STENT INTERVENTION;  Surgeon: Burnell Blanks, MD;  Location: [facility];  Service: Cardiovascular;  Laterality: N/A;  . CORONARY/GRAFT ACUTE MI REVASCULARIZATION N/A 12/14/2019   Procedure: Coronary/Graft Acute MI Revascularization;  Surgeon: Burnell Blanks, MD;  Location: [place] [facility];  Service: Cardiovascular;  Laterality: N/A;  . LEFT HEART CATH AND CORONARY ANGIOGRAPHY N/A 12/14/2019   Procedure: LEFT HEART CATH AND CORONARY ANGIOGRAPHY;  Surgeon: Burnell Blanks, MD;  Location: [facility] [facility];  Service: Cardiovascular;  Laterality: N/A;  . POLYPECTOMY  01/09/2019   Procedure: POLYPECTOMY;  Surgeon: Jerene Bears, MD;  [facility];  Service: Gastroenterology;;  . TONSILLECTOMY    . WISDOM TOOTH EXTRACTION      Allergies  Allergies  Allergen Reactions  . Fenofibrate Nausea And Vomiting  . Tylox [Oxycodone-Acetaminophen] Other (See Comments)    Migraine HA  and GI upset    History of Present Illness    [name] has a past medical history of diabetes mellitus type 2, hypertension, hyperlipidemia, NSTEMI status post cardiac catheterization with PCI DES x1, and preserved ejection fraction.  She presented to [facility] with chest pain on 12/13/2019.  Her initial EKG did not show ST elevation however, repeat EKG showed ST elevation in her posterior leads.  She had ongoing chest pain and was transferred to [facility] with a STEMI.  She received DES x1 to her proximal RCA.  Her catheterization also showed a 30% proximal RCA, mid to distal circumflex 30%, distal LAD 30%.  She was started on aspirin and Brilinta.  She presented to the clinic 12/25/2019 for follow-up and stated she was feeling much better since having her stent placed.  She did notice some dyspnea with more intense activities however with normal activities her breathing is normal.  She stated that she had been having intermittent chest pain for almost a year and felt it was related to stress.  Since she has had her stent placed she had no recurrent episodes of chest pain.  She was compliant with her Brilinta medication and was trying to maintain a low-sodium diet.  She worked as a Software engineer for a obese patient.  She felt she needed 1 more week to recover before returning to work.  I continued her  medication therapy, gave her the salty 6 handout, and encourage her to increase her physical activity to 15 minutes twice daily.  She was seen by Dr. Gwenlyn Found on 03/25/2020 during that time she complained of daytime somnolence that was felt to be related to OSA.  She indicated that she had been on CPAP in the past.  Her blood pressure was slightly elevated in the clinic.  However home blood pressure log showed controlled blood pressures.  She presents to the clinic today for follow-up evaluation and states she has continued to have fatigue/activity intolerance.  She has remained sedentary  and is still in the process of looking for work that will accommodate her decreased endurance.  She states that she did however climb 1/2 mile trail and then to marry a friend about 1 week ago.  She states that she had difficulty doing this but was able to complete the task.  She has been started on Farxiga by her PCP and has had great improvement with her blood sugars.  She has not yet completed her sleep study.  I will follow up with this and send her to Dr. Claiborne Billings for further evaluation after the study.  She states that her blood pressures were well controlled at home when she stopped taking further readings.  I will have her check her blood pressure once per week, increase her physical activity as tolerated, and follow-up with Dr. Gwenlyn Found or myself in 6 months.  Today she denies chest pain, increased shortness of breath, lower extremity edema, palpitations, melena, hematuria, hemoptysis, diaphoresis, weakness, presyncope, syncope, orthopnea, and PND.   Home Medications    Prior to Admission medications   Medication Sig Start Date End Date Taking? Authorizing Provider  ACCU-CHEK SOFTCLIX LANCETS lancets 1 each by Misc.(Non-Drug; Combo Route) route daily. 09/06/18   [provider]  amitriptyline (ELAVIL) 25 MG tablet Take 25 mg by mouth at bedtime as needed for sleep.  09/05/18   [provider]  aspirin 81 MG chewable tablet Chew 1 tablet (81 mg total) by mouth daily. 12/17/19   Barrett, Evelene Croon, PA-C  atorvastatin (LIPITOR) 80 MG tablet Take 1 tablet (80 mg total) by mouth daily at 6 PM. Patient taking differently: Take 80 mg by mouth every morning.  12/16/19   Barrett, Evelene Croon, PA-C  Blood Glucose Monitoring Suppl (GLUCOCOM BLOOD GLUCOSE MONITOR) DEVI 1 each by Misc.(Non-Drug; Combo Route) route daily. Include strips. Lancets, lancet device, control solution. batteries 09/06/18   [provider]  cholecalciferol (VITAMIN D3) 25 MCG (1000 UT) tablet Take 1,000 Units by mouth  daily.    [provider]  ezetimibe (ZETIA) 10 MG tablet Take 1 tablet (10 mg total) by mouth daily. 03/27/20   Lorretta Harp, MD  glucose blood test strip 1 each by Misc.(Non-Drug; Combo Route) route daily. 09/06/18   [provider]  hydrochlorothiazide (HYDRODIURIL) 25 MG tablet Take 25 mg by mouth daily. 02/26/20   [provider]  latanoprost (XALATAN) 0.005 % ophthalmic solution 1 drop at bedtime. 12/25/19   [provider]  metFORMIN (GLUCOPHAGE-XR) 500 MG 24 hr tablet Take 1,000 mg by mouth 2 (two) times daily with a meal.  09/22/18   [provider]  metoprolol tartrate (LOPRESSOR) 25 MG tablet Take 1 tablet (25 mg total) by mouth 2 (two) times daily. 12/16/19   Barrett, Evelene Croon, PA-C  nitroGLYCERIN (NITROSTAT) 0.4 MG SL tablet Place 1 tablet (0.4 mg total) under the tongue every 5 (five) minutes x 3 doses as needed for chest pain. 12/16/19   Barrett, Evelene Croon, PA-C  NON FORMULARY Place 1 drop into both  eyes daily.    [provider]  nystatin (MYCOSTATIN/NYSTOP) powder Apply 1 Bottle topically daily as needed (irritation).  06/05/18   [provider]  ticagrelor (BRILINTA) 90 MG TABS tablet Take 1 tablet (90 mg total) by mouth 2 (two) times daily. 12/16/19   Barrett, Evelene Croon, PA-C  valsartan (DIOVAN) 80 MG tablet Take 80 mg by mouth daily.  09/06/18   [provider]  vitamin C (ASCORBIC ACID) 500 MG tablet Take 500 mg by mouth daily.    [provider]    Family History    Family History  Problem Relation Age of Onset  . CVA Mother   . Cerebral aneurysm Mother   . Clotting disorder Mother   . Aneurysm Maternal Grandmother   . Mental illness Sister   . Alcoholism Brother   . Throat cancer Maternal Grandfather   . Bipolar disorder Sister   . ADD / ADHD Son   . Colon cancer Neg Hx   . Breast cancer Neg Hx   . Esophageal cancer Neg Hx   . Rectal cancer Neg Hx   . Stomach cancer Neg Hx    She  indicated that her mother is deceased. She indicated that her father is deceased. She indicated that only one of her two sisters is alive. She indicated that only one of her two brothers is alive. She reported the following about her brother of unknown status: unknown. She indicated that her maternal grandmother is deceased. She indicated that her maternal grandfather is deceased. She indicated that her paternal grandmother is deceased. She indicated that her paternal grandfather is deceased. She indicated that the status of her son is unknown. She indicated that the status of her neg hx is unknown.  Social History    Social History   Socioeconomic History  . Marital status: Divorced    Spouse name: Not on file  . Number of children: Not on file  . Years of education: Not on file  . Highest education level: Not on file  Occupational History  . Not on file  Tobacco Use  . Smoking status: Former Smoker    Quit date: 07/21/2005    Years since quitting: 14.9  . Smokeless tobacco: Never Used  . Tobacco comment: quit 2008  Substance and Sexual Activity  . Alcohol use: Yes    Alcohol/week: 0.0 standard drinks    Comment: occasionally  . Drug use: No  . Sexual activity: Yes    Birth control/protection: I.U.D.  Other Topics Concern  . Not on file  Social History Narrative  . Not on file   Social Determinants of Health   Financial Resource Strain:   . Difficulty of Paying Living Expenses: Not on file  Food Insecurity:   . Worried About Charity fundraiser in the Last Year: Not on file  . Ran Out of Food in the Last Year: Not on file  Transportation Needs:   . Lack of Transportation (Medical): Not on file  . Lack of Transportation (Non-Medical): Not on file  Physical Activity:   . Days of Exercise per Week: Not on file  . Minutes of Exercise per Session: Not on file  Stress:   . Feeling of Stress : Not on file  Social Connections:   . Frequency of Communication with Friends and  Family: Not on file  . Frequency of Social Gatherings with Friends and Family: Not on file  . Attends Religious Services: Not on file  . Active  Member of Clubs or Organizations: Not on file  . Attends Archivist Meetings: Not on file  . Marital Status: Not on file  Intimate Partner Violence:   . Fear of Current or Ex-Partner: Not on file  . Emotionally Abused: Not on file  . Physically Abused: Not on file  . Sexually Abused: Not on file     Review of Systems    General:  No chills, fever, night sweats or weight changes.  Cardiovascular:  No chest pain, dyspnea on exertion, edema, orthopnea, palpitations, paroxysmal nocturnal dyspnea. Dermatological: No rash, lesions/masses Respiratory: No cough, dyspnea Urologic: No hematuria, dysuria Abdominal:   No nausea, vomiting, diarrhea, bright red blood per rectum, melena, or hematemesis Neurologic:  No visual changes, wkns, changes in mental status. All other systems reviewed and are otherwise negative except as noted above.  Physical Exam    VS:  BP 138/84   Pulse 74   Ht 5\' 6"  (1.676 m)   Wt (!) 321 lb (145.6 kg)   BMI 51.81 kg/m  , BMI Body mass index is 51.81 kg/m. GEN: Well nourished, well developed, in no acute distress. HEENT: normal. Neck: Supple, no JVD, carotid bruits, or masses. Cardiac: RRR, no murmurs, rubs, or gallops. No clubbing, cyanosis, edema.  Radials/DP/PT 2+ and equal bilaterally.  Respiratory:  Respirations regular and unlabored, clear to auscultation bilaterally. GI: Soft, nontender, nondistended, BS + x 4. MS: no deformity or atrophy. Skin: warm and dry, no rash. Neuro:  Strength and sensation are intact. Psych: Normal affect.  Accessory Clinical Findings    Recent Labs: 12/15/2019: BUN 21; Creatinine, Ser 0.68; Hemoglobin 14.0; Platelets 250; Potassium 4.0; Sodium 136 03/25/2020: ALT 23   Recent Lipid Panel    Component Value Date/Time   CHOL 87 (L) 05/27/2020 0905   TRIG 93 05/27/2020  0905   HDL 33 (L) 05/27/2020 0905   CHOLHDL 2.6 05/27/2020 0905   CHOLHDL 4.4 12/13/2019 2149   VLDL 37 12/13/2019 2149   LDLCALC 36 05/27/2020 0905    ECG personally reviewed by me today-none today.  EKG 12/25/2019 normal sinus rhythm possible inferior infarct undetermined age 38 bpm- No acute changes  EKG 12/17/2019 Sinus rhythm possible acute inferior infarct 72 bpm   Echocardiogram 12/14/2019 1. Overall EF preserved small area of inferior basal hypokinesis . Left  ventricular ejection fraction, by estimation, is 55 to 60%. The left  ventricle has normal function. The left ventricle demonstrates regional  wall motion abnormalities (see scoring  diagram/findings for description). There is severely increased left  ventricular hypertrophy. Left ventricular diastolic parameters are  consistent with Grade I diastolic dysfunction (impaired relaxation).  2. Right ventricular systolic function is moderately reduced. The right  ventricular size is moderately enlarged.  3. The mitral valve is normal in structure and function. No evidence of  mitral valve regurgitation. No evidence of mitral stenosis.  4. The aortic valve is tricuspid. Aortic valve regurgitation is not  visualized. Mild to moderate aortic valve sclerosis/calcification is  present, without any evidence of aortic stenosis.  Cardiac catheterization 12/14/2019  Prox RCA-1 lesion is 30% stenosed.  Prox RCA-2 lesion is 100% stenosed.  Mid Cx to Dist Cx lesion is 30% stenosed.  Dist LAD lesion is 30% stenosed.  A drug-eluting stent was successfully placed using a SYNERGY XD 3.0X16.  Post intervention, there is a 0% residual stenosis.  1. Inferior STEMI secondary to thrombotic occlusion of the mid RCA 2. Successful PTCA/DES x 1 mid RCA 3. Mild non-obstructive  disease in the proximal RCA, distal Circumflex and distal LAD 4. Elevated LVEDP  Recommendations: Will continue Aggrastat drip for 12 hours given heavy  thrombus burden within the stented segment. Continue ASA and Brilinta for one year. Echo later today. High intensity statin. Beta blocker as heart rate tolerates.    Diagnostic Dominance: Right  Intervention       Assessment & Plan   1.  Essential hypertension-BP today 138/80.  Well-controlled at home blood pressures in the 130s over 80s previously. Continue metoprolol, valsartan Heart healthy low-sodium diet-salty 6 given Increase physical activity as tolerated-goal 150 minutes of moderate physical activity weekly. Blood pressure log  OSA-previously new CPAP.  Referr to Dr. Claiborne Billings for repeat sleep evaluation.  Weight loss encouraged Repeat sleep study  Extreme obesity-weight today 321 pounds down from 331 03/27/2020. Continue weight loss Heart healthy low-sodium diet Increase physical activity as tolerated  CAD-no chest pain today. Her initial EKG did not show ST elevation however, repeat EKG showed ST elevation in her posterior leads.  She had ongoing chest pain and was transferred to [facility] with a STEMI.  12/14/2019 She received DES x1 to her proximal RCA.  Her catheterization also showed a 30% proximal RCA, mid to distal circumflex 30%, distal LAD 30%.  Continue Brilinta, aspirin, metoprolol, valsartan Heart healthy low-sodium diet-salty 6 given Increase physical activity as tolerated  Hyperlipidemia-12/13/2019: VLDL 37 05/27/2020: Cholesterol, Total 87; HDL 33; LDL Chol Calc (NIH) 36; Triglycerides 93 Continue atorvastatin 80 mg daily Increase physical activity as tolerated Heart healthy low-sodium high-fiber diet  Disposition: Follow-up with Dr. Gwenlyn Found or me in 6 months.  Follow-up with Dr. Claiborne Billings after sleep study.  Jossie Ng. Glynnis Gavel NP-C    06/20/2020, 8:43 AM [place] [address] Office [phone] Fax [phone]  Notice: This dictation was prepared with Dragon dictation along with smaller phrase technology. Any  transcriptional errors that result from this process are unintentional and may not be corrected upon review.

## 2020-06-20 ENCOUNTER — Encounter: Payer: Self-pay | Admitting: General Practice

## 2020-06-20 ENCOUNTER — Ambulatory Visit (INDEPENDENT_AMBULATORY_CARE_PROVIDER_SITE_OTHER): Payer: Medicaid Other | Admitting: General Practice

## 2020-06-20 ENCOUNTER — Other Ambulatory Visit: Payer: Self-pay

## 2020-06-20 VITALS — BP 138/84 | HR 74 | Ht 66.0 in | Wt 321.0 lb

## 2020-06-20 DIAGNOSIS — I10 Essential (primary) hypertension: Secondary | ICD-10-CM | POA: Diagnosis not present

## 2020-06-20 DIAGNOSIS — I2510 Atherosclerotic heart disease of native coronary artery without angina pectoris: Secondary | ICD-10-CM | POA: Diagnosis not present

## 2020-06-20 DIAGNOSIS — G4733 Obstructive sleep apnea (adult) (pediatric): Secondary | ICD-10-CM | POA: Diagnosis not present

## 2020-06-20 DIAGNOSIS — E668 Other obesity: Secondary | ICD-10-CM | POA: Diagnosis not present

## 2020-06-20 DIAGNOSIS — E7800 Pure hypercholesterolemia, unspecified: Secondary | ICD-10-CM

## 2020-06-20 MED ORDER — DAPAGLIFLOZIN PROPANEDIOL 5 MG PO TABS: 5.0000 mg | ORAL_TABLET | Freq: Every day | ORAL | Status: DC

## 2020-06-20 NOTE — Patient Instructions (Signed)
Medication Instructions:  The current medical regimen is effective;  continue present plan and medications as directed. Please refer to the Current Medication list given to you today. *If you need a refill on your cardiac medications before your next appointment, please call your pharmacy*  Testing/Procedures: Your physician has recommended that you have a sleep study. This test records several body functions during sleep, including: brain activity, eye movement, oxygen and carbon dioxide blood levels, heart rate and rhythm, breathing rate and rhythm, the flow of air through your mouth and nose, snoring, body muscle movements, and chest and belly movement. SOMEONE WILL BE CALLING YOU TO DISCUSS/SCHEDULE THIS TEST  [employer] TAKE AND LOG YOUR BLOOD PRESSURE WEEKLY. PLEASE CALL IF YOUR BLOOD PRESSURE IS >130/80  PLEASE READ AND FOLLOW SALTY 6-ATTACHED  PLEASE INCREASE PHYSICAL ACTIVITY AS TOLERATED  Follow-Up: Your next appointment:  FOLLOW UP WITH DR Claiborne Billings AFTER SLEEP STUDY AND  WITH DR Gwenlyn Found IN 6 month(s) Please call our office 2 months in advance to schedule this appointment In Person with Quay Burow, MD  At [facility], you and your health needs are our priority.  As part of our continuing mission to provide you with exceptional heart care, we have created designated Provider Care Teams.  These Care Teams include your primary Cardiologist (physician) and Advanced Practice Providers (APPs -  Physician Assistants and Nurse Practitioners) who all work together to provide you with the care you need, when you need it.

## 2020-06-25 ENCOUNTER — Telehealth: Payer: Self-pay | Admitting: *Deleted

## 2020-06-25 NOTE — Telephone Encounter (Signed)
PA request for in lab sleep study submitted to [employer] via [employer] web portal.

## 2020-07-16 ENCOUNTER — Telehealth: Payer: Self-pay | Admitting: *Deleted

## 2020-07-16 NOTE — Telephone Encounter (Signed)
Left sleep study appointment details on Voice Mail.

## 2020-08-11 ENCOUNTER — Encounter: Payer: Self-pay | Admitting: Podiatry

## 2020-08-11 ENCOUNTER — Ambulatory Visit (INDEPENDENT_AMBULATORY_CARE_PROVIDER_SITE_OTHER): Payer: Medicaid Other | Admitting: Podiatry

## 2020-08-11 ENCOUNTER — Other Ambulatory Visit: Payer: Self-pay

## 2020-08-11 DIAGNOSIS — M79674 Pain in right toe(s): Secondary | ICD-10-CM | POA: Diagnosis not present

## 2020-08-11 DIAGNOSIS — B351 Tinea unguium: Secondary | ICD-10-CM | POA: Diagnosis not present

## 2020-08-11 DIAGNOSIS — M79675 Pain in left toe(s): Secondary | ICD-10-CM | POA: Diagnosis not present

## 2020-08-11 DIAGNOSIS — E1142 Type 2 diabetes mellitus with diabetic polyneuropathy: Secondary | ICD-10-CM

## 2020-08-11 NOTE — Progress Notes (Signed)
Subjective:  Patient ID: Kristin Reilly, female    DOB: 1966-01-05,  MRN: [id number]  Kristin Reilly presents to clinic today for preventative diabetic foot care and painful thick toenails that are difficult to trim. Pain interferes with ambulation. Aggravating factors include wearing enclosed shoe gear. Pain is relieved with periodic professional debridement.  54 y.o. female presents with the above complaint.  She is still on blood thinner, Brilinta.  States she is still trying to apply moisturizer to feet and gets it done about once per week.  She voices no new pedal problems on today's visit.  Review of Systems: Negative except as noted in the HPI. Past Medical History:  Diagnosis Date  . Arthritis   . Depression   . Glaucoma   . Hyperlipidemia   . Hypertension   . Sleep apnea    no cpap   Past Surgical History:  Procedure Laterality Date  . APPENDECTOMY    . COLONOSCOPY N/A 08/05/2015   Procedure: COLONOSCOPY;  Surgeon: Jerene Bears, MD;  Location: [facility];  Service: Gastroenterology;  Laterality: N/A;  . COLONOSCOPY WITH PROPOFOL N/A 01/09/2019   Procedure: COLONOSCOPY WITH PROPOFOL;  Surgeon: Jerene Bears, MD;  Location: [facility];  Service: Gastroenterology;  Laterality: N/A;  . CORONARY STENT INTERVENTION N/A 12/14/2019   Procedure: CORONARY STENT INTERVENTION;  Surgeon: Burnell Blanks, MD;  Location: [facility];  Service: Cardiovascular;  Laterality: N/A;  . CORONARY/GRAFT ACUTE MI REVASCULARIZATION N/A 12/14/2019   Procedure: Coronary/Graft Acute MI Revascularization;  Surgeon: Burnell Blanks, MD;  Location: [place] [facility];  Service: Cardiovascular;  Laterality: N/A;  . LEFT HEART CATH AND CORONARY ANGIOGRAPHY N/A 12/14/2019   Procedure: LEFT HEART CATH AND CORONARY ANGIOGRAPHY;  Surgeon: Burnell Blanks, MD;  Location: [place] [facility];  Service: Cardiovascular;  Laterality: N/A;  . POLYPECTOMY  01/09/2019   Procedure:  POLYPECTOMY;  Surgeon: Jerene Bears, MD;  Location: [facility];  Service: Gastroenterology;;  . TONSILLECTOMY    . WISDOM TOOTH EXTRACTION      Current Outpatient Medications:  .  ACCU-CHEK SOFTCLIX LANCETS lancets, 1 each by Misc.(Non-Drug; Combo Route) route daily., Disp: , Rfl:  .  amitriptyline (ELAVIL) 25 MG tablet, Take 25 mg by mouth at bedtime as needed for sleep. , Disp: , Rfl:  .  aspirin 81 MG chewable tablet, Chew 1 tablet (81 mg total) by mouth daily., Disp:  , Rfl:  .  atorvastatin (LIPITOR) 80 MG tablet, Take 1 tablet (80 mg total) by mouth daily at 6 PM. (Patient taking differently: Take 80 mg by mouth every morning. ), Disp: 30 tablet, Rfl: 11 .  Blood Glucose Monitoring Suppl (GLUCOCOM BLOOD GLUCOSE MONITOR) DEVI, 1 each by Misc.(Non-Drug; Combo Route) route daily. Include strips. Lancets, lancet device, control solution. batteries, Disp: , Rfl:  .  cholecalciferol (VITAMIN D3) 25 MCG (1000 UT) tablet, Take 1,000 Units by mouth daily., Disp: , Rfl:  .  dapagliflozin propanediol (FARXIGA) 5 MG TABS tablet, Take 1 tablet (5 mg total) by mouth daily before breakfast., Disp: 30 tablet, Rfl:  .  ezetimibe (ZETIA) 10 MG tablet, Take 1 tablet (10 mg total) by mouth daily., Disp: 30 tablet, Rfl: 11 .  glucose blood test strip, 1 each by Misc.(Non-Drug; Combo Route) route daily., Disp: , Rfl:  .  hydrochlorothiazide (HYDRODIURIL) 25 MG tablet, Take 25 mg by mouth daily., Disp: , Rfl:  .  latanoprost (XALATAN) 0.005 % ophthalmic solution, 1 drop at bedtime.,  Disp: , Rfl:  .  metFORMIN (GLUCOPHAGE-XR) 500 MG 24 hr tablet, Take 1,000 mg by mouth 2 (two) times daily with a meal. , Disp: , Rfl:  .  metoprolol tartrate (LOPRESSOR) 25 MG tablet, Take 1 tablet (25 mg total) by mouth 2 (two) times daily., Disp: 60 tablet, Rfl: 11 .  nitroGLYCERIN (NITROSTAT) 0.4 MG SL tablet, Place 1 tablet (0.4 mg total) under the tongue every 5 (five) minutes x 3 doses as needed for chest pain., Disp: 25  tablet, Rfl: 12 .  NON FORMULARY, Place 1 drop into both eyes daily., Disp: , Rfl:  .  nystatin (MYCOSTATIN/NYSTOP) powder, Apply 1 Bottle topically daily as needed (irritation). , Disp: , Rfl:  .  ticagrelor (BRILINTA) 90 MG TABS tablet, Take 1 tablet (90 mg total) by mouth 2 (two) times daily., Disp: 60 tablet, Rfl: 11 .  valsartan (DIOVAN) 80 MG tablet, Take 80 mg by mouth daily. , Disp: , Rfl:  .  vitamin C (ASCORBIC ACID) 500 MG tablet, Take 500 mg by mouth daily., Disp: , Rfl:  Allergies  Allergen Reactions  . Fenofibrate Nausea And Vomiting  . Tylox [Oxycodone-Acetaminophen] Other (See Comments)    Migraine HA and GI upset   Social History   Occupational History  . Not on file  Tobacco Use  . Smoking status: Former Smoker    Quit date: 07/21/2005    Years since quitting: 15.0  . Smokeless tobacco: Never Used  . Tobacco comment: quit 2008  Substance and Sexual Activity  . Alcohol use: Yes    Alcohol/week: 0.0 standard drinks    Comment: occasionally  . Drug use: No  . Sexual activity: Yes    Birth control/protection: I.U.D.    Objective:   Constitutional Kristin Reilly is a pleasant 54 y.o. Caucasian female, morbidly obese in NAD.Marland Kitchen AAO x 3.   Vascular Dorsalis pedis pulses palpable bilaterally. Posterior tibial pulses palpable bilaterally. Capillary refill normal to all digits. No cyanosis or clubbing noted. Pedal hair growth sparse b/l.  Neurologic Normal speech.Oriented to person, place, and time. Pt has subjective symptoms of neuropathy. Protective sensation intact 5/5 intact bilaterally with 10g monofilament b/l. Vibratory sensation intact b/l. Proprioception intact bilaterally. Clonus negative b/l.  Dermatologic Pedal skin with normal turgor, texture and tone b/l.  Toenails are discolored, thick, elongated, dystrophic with pain on palpation x 10. No open wounds. No skin lesions.  Orthopedic: Normal muscle strength 5/5 to all lower extremity muscle groups  bilaterally. No pain crepitus or joint limitation noted with ROM b/l. No gross bony deformities bilaterally. No bony tenderness.    Radiographs: None Assessment:   1. Pain due to onychomycosis of toenails of both feet   2. Diabetic peripheral neuropathy associated with type 2 diabetes mellitus ([place])    Plan:  Patient was evaluated and treated and all questions answered.  Onychomycosis with pain -Nails palliatively debridement as below -Educated on self-care  Procedure: Nail Debridement Rationale: Pain Type of Debridement: manual, sharp debridement. Instrumentation: Nail nipper, rotary burr. Number of Nails: 10 -Continue diabetic foot care principles. -Toenails 1-5 b/l were debrided in length and girth with sterile nail nippers and dremel without iatrogenic bleeding.  -Patient to report any pedal injuries to medical professional immediately. -Patient to continue soft, supportive shoe gear daily. -Patient/POA to call should there be question/concern in the interim.  Return in about 3 months (around 11/11/2020).  Marzetta Board, DPM

## 2020-08-20 ENCOUNTER — Encounter (HOSPITAL_BASED_OUTPATIENT_CLINIC_OR_DEPARTMENT_OTHER): Payer: Medicaid Other | Admitting: Cardiovascular Disease

## 2020-09-27 ENCOUNTER — Emergency Department (HOSPITAL_COMMUNITY): Payer: Medicaid Other

## 2020-09-27 ENCOUNTER — Other Ambulatory Visit: Payer: Self-pay

## 2020-09-27 ENCOUNTER — Encounter (HOSPITAL_COMMUNITY): Payer: Self-pay

## 2020-09-27 ENCOUNTER — Emergency Department (HOSPITAL_COMMUNITY)
Admission: EM | Admit: 2020-09-27 | Discharge: 2020-09-27 | Disposition: A | Discharge status: Home / Self Care | Payer: Medicaid Other | Attending: Emergency Medicine | Admitting: Emergency Medicine

## 2020-09-27 DIAGNOSIS — S301XXA Contusion of abdominal wall, initial encounter: Secondary | ICD-10-CM | POA: Insufficient documentation

## 2020-09-27 DIAGNOSIS — Y92410 Unspecified street and highway as the place of occurrence of the external cause: Secondary | ICD-10-CM | POA: Insufficient documentation

## 2020-09-27 DIAGNOSIS — Z86010 Personal history of colonic polyps: Secondary | ICD-10-CM | POA: Diagnosis not present

## 2020-09-27 DIAGNOSIS — R079 Chest pain, unspecified: Secondary | ICD-10-CM | POA: Diagnosis not present

## 2020-09-27 DIAGNOSIS — Z955 Presence of coronary angioplasty implant and graft: Secondary | ICD-10-CM | POA: Insufficient documentation

## 2020-09-27 DIAGNOSIS — S7012XA Contusion of left thigh, initial encounter: Secondary | ICD-10-CM | POA: Diagnosis not present

## 2020-09-27 DIAGNOSIS — S3991XA Unspecified injury of abdomen, initial encounter: Secondary | ICD-10-CM | POA: Diagnosis present

## 2020-09-27 DIAGNOSIS — Z951 Presence of aortocoronary bypass graft: Secondary | ICD-10-CM | POA: Insufficient documentation

## 2020-09-27 DIAGNOSIS — E1169 Type 2 diabetes mellitus with other specified complication: Secondary | ICD-10-CM | POA: Insufficient documentation

## 2020-09-27 DIAGNOSIS — E785 Hyperlipidemia, unspecified: Secondary | ICD-10-CM | POA: Diagnosis not present

## 2020-09-27 DIAGNOSIS — Z7984 Long term (current) use of oral hypoglycemic drugs: Secondary | ICD-10-CM | POA: Diagnosis not present

## 2020-09-27 DIAGNOSIS — Z79899 Other long term (current) drug therapy: Secondary | ICD-10-CM | POA: Insufficient documentation

## 2020-09-27 DIAGNOSIS — Z7982 Long term (current) use of aspirin: Secondary | ICD-10-CM | POA: Insufficient documentation

## 2020-09-27 DIAGNOSIS — I10 Essential (primary) hypertension: Secondary | ICD-10-CM | POA: Insufficient documentation

## 2020-09-27 DIAGNOSIS — Z87891 Personal history of nicotine dependence: Secondary | ICD-10-CM | POA: Insufficient documentation

## 2020-09-27 LAB — COMPREHENSIVE METABOLIC PANEL
ALT: 21 U/L (ref 0–44)
AST: 15 U/L (ref 15–41)
Albumin: 3.8 g/dL (ref 3.5–5.0)
Alkaline Phosphatase: 131 U/L — ABNORMAL HIGH (ref 38–126)
Anion gap: 11 (ref 5–15)
BUN: 16 mg/dL (ref 6–20)
CO2: 22 mmol/L (ref 22–32)
Calcium: 8.7 mg/dL — ABNORMAL LOW (ref 8.9–10.3)
Chloride: 107 mmol/L (ref 98–111)
Creatinine, Ser: 0.59 mg/dL (ref 0.44–1.00)
GFR, Estimated: >60 mL/min (ref >60)
Glucose, Bld: 200 mg/dL — ABNORMAL HIGH (ref 70–99)
Potassium: 3.4 mmol/L — ABNORMAL LOW (ref 3.5–5.1)
Sodium: 140 mmol/L (ref 135–145)
Total Bilirubin: 0.4 mg/dL (ref 0.3–1.2)
Total Protein: 7.1 g/dL (ref 6.5–8.1)

## 2020-09-27 LAB — CBC WITH DIFFERENTIAL/PLATELET
Abs Immature Granulocytes: 0.03 10*3/uL (ref 0.00–0.07)
Basophils Absolute: 0 10*3/uL (ref 0.0–0.1)
Basophils Relative: 0 %
Eosinophils Absolute: 0.2 10*3/uL (ref 0.0–0.5)
Eosinophils Relative: 2 %
HCT: 43.4 % (ref 36.0–46.0)
Hemoglobin: 13.4 g/dL (ref 12.0–15.0)
Immature Granulocytes: 0 %
Lymphocytes Relative: 25 %
Lymphs Abs: 2.1 10*3/uL (ref 0.7–4.0)
MCH: 26.2 pg (ref 26.0–34.0)
MCHC: 30.9 g/dL (ref 30.0–36.0)
MCV: 84.8 fL (ref 80.0–100.0)
Monocytes Absolute: 0.6 10*3/uL (ref 0.1–1.0)
Monocytes Relative: 7 %
Neutro Abs: 5.6 10*3/uL (ref 1.7–7.7)
Neutrophils Relative %: 66 %
Platelets: 281 10*3/uL (ref 150–400)
RBC: 5.12 MIL/uL — ABNORMAL HIGH (ref 3.87–5.11)
RDW: 14.8 % (ref 11.5–15.5)
WBC: 8.6 10*3/uL (ref 4.0–10.5)
nRBC: 0 % (ref 0.0–0.2)

## 2020-09-27 MED ORDER — IOHEXOL 300 MG/ML  SOLN
100.0000 mL | Freq: Once | INTRAMUSCULAR | Status: AC | PRN
  Administered 2020-09-27: 100 mL via INTRAVENOUS

## 2020-09-27 MED ORDER — METHOCARBAMOL 500 MG PO TABS: 500.0000 mg | ORAL_TABLET | Freq: Three times a day (TID) | ORAL | 0 refills | Status: DC | PRN
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
## 2020-11-12 ENCOUNTER — Ambulatory Visit: Payer: Medicaid Other | Admitting: Podiatry

## 2021-04-07 ENCOUNTER — Ambulatory Visit (INDEPENDENT_AMBULATORY_CARE_PROVIDER_SITE_OTHER): Payer: Medicaid Other | Admitting: Podiatry

## 2021-04-07 ENCOUNTER — Other Ambulatory Visit: Payer: Self-pay

## 2021-04-07 ENCOUNTER — Encounter: Payer: Self-pay | Admitting: Podiatry

## 2021-04-07 DIAGNOSIS — E1142 Type 2 diabetes mellitus with diabetic polyneuropathy: Secondary | ICD-10-CM

## 2021-04-07 NOTE — Progress Notes (Signed)
Patient left before being seen due to excessive wait time.

## 2021-04-08 ENCOUNTER — Encounter: Payer: Self-pay | Admitting: Podiatry

## 2021-05-01 ENCOUNTER — Encounter: Payer: Self-pay | Admitting: *Deleted

## 2021-05-07 NOTE — Progress Notes (Signed)
error 

## 2021-05-27 ENCOUNTER — Telehealth: Payer: Self-pay

## 2021-05-27 NOTE — Telephone Encounter (Signed)
Received note from Eldridge Abrahams, FNP-C at [facility].   Ph: [phone] Fax: [phone]

## 2021-05-29 ENCOUNTER — Telehealth: Payer: Self-pay | Admitting: Cardiovascular Disease

## 2021-05-29 NOTE — Telephone Encounter (Signed)
Patient is requesting to switch from Dr. Gwenlyn Found to Dr. Gasper Sells. Please advise.

## 2021-06-05 ENCOUNTER — Encounter: Payer: Self-pay | Admitting: Internal Medicine

## 2021-06-05 ENCOUNTER — Ambulatory Visit (INDEPENDENT_AMBULATORY_CARE_PROVIDER_SITE_OTHER): Payer: Medicaid Other | Admitting: Internal Medicine

## 2021-06-05 ENCOUNTER — Other Ambulatory Visit: Payer: Self-pay

## 2021-06-05 VITALS — BP 120/80 | HR 80 | Ht 66.0 in | Wt 307.6 lb

## 2021-06-05 DIAGNOSIS — I700 Atherosclerosis of aorta: Secondary | ICD-10-CM | POA: Insufficient documentation

## 2021-06-05 DIAGNOSIS — E1159 Type 2 diabetes mellitus with other circulatory complications: Secondary | ICD-10-CM

## 2021-06-05 DIAGNOSIS — Z87891 Personal history of nicotine dependence: Secondary | ICD-10-CM

## 2021-06-05 DIAGNOSIS — E119 Type 2 diabetes mellitus without complications: Secondary | ICD-10-CM | POA: Insufficient documentation

## 2021-06-05 DIAGNOSIS — I152 Hypertension secondary to endocrine disorders: Secondary | ICD-10-CM

## 2021-06-05 DIAGNOSIS — I25118 Atherosclerotic heart disease of native coronary artery with other forms of angina pectoris: Secondary | ICD-10-CM

## 2021-06-05 DIAGNOSIS — E1169 Type 2 diabetes mellitus with other specified complication: Secondary | ICD-10-CM

## 2021-06-05 DIAGNOSIS — G4733 Obstructive sleep apnea (adult) (pediatric): Secondary | ICD-10-CM

## 2021-06-05 DIAGNOSIS — E669 Obesity, unspecified: Secondary | ICD-10-CM

## 2021-06-05 DIAGNOSIS — E782 Mixed hyperlipidemia: Secondary | ICD-10-CM

## 2021-06-05 NOTE — Progress Notes (Signed)
Cardiology Office Note:    Date:  06/05/2021   ID:  Kristin Reilly, DOB Jun 23, 1966, MRN [id number]  PCP:  Berkley Harvey, NP   [facility] Providers Cardiologist:  Werner Lean, MD     Referring MD: Berkley Harvey, NP   CC:  Transition to new cardiologist  History of Present Illness:    Kristin Reilly is a 55 y.o. female with a hx of Morbid Obesity HTN with DM, OSA not on  CPAP, CAD with history of inferior MI, HLD, former smoker; who presents for evaluation 06/05/21.  Patient notes that she is sweating a lot.  Notes that the had an episode where she was walking around in the last month in a half with walking with exertion. And has had nausea and diaphoresis.  Once episode when taking the busy and walking in the heart- she had vomiting.  Has had between 3-6 epsiodes in the last 1.5 months:  gets nauseous, sweaty, weak fatigue, and tired with minimal activity.  No chest pain or pressure; no jaw pain (patients prior anginal equivalent).  No SOB but does not DOE with mild activity. and no PND/Orthopnea.  No weight gain or leg swelling- does not recent flea bites from her cat's fleas on her legs..  No palpitations or syncope.   Past Medical History:  Diagnosis Date   Arthritis    Depression    Glaucoma    Hyperlipidemia    Hypertension    Sleep apnea    no cpap    Past Surgical History:  Procedure Laterality Date   APPENDECTOMY     COLONOSCOPY N/A 08/05/2015   Procedure: COLONOSCOPY;  Surgeon: Jerene Bears, MD;  Location: [facility];  Service: Gastroenterology;  Laterality: N/A;   COLONOSCOPY WITH PROPOFOL N/A 01/09/2019   Procedure: COLONOSCOPY WITH PROPOFOL;  Surgeon: Jerene Bears, MD;  Location: [facility];  Service: Gastroenterology;  Laterality: N/A;   CORONARY STENT INTERVENTION N/A 12/14/2019   Procedure: CORONARY STENT INTERVENTION;  Surgeon: Burnell Blanks, MD;  Location: [place] [facility];  Service: Cardiovascular;  Laterality: N/A;    CORONARY/GRAFT ACUTE MI REVASCULARIZATION N/A 12/14/2019   Procedure: Coronary/Graft Acute MI Revascularization;  Surgeon: Burnell Blanks, MD;  Location: [place] [facility];  Service: Cardiovascular;  Laterality: N/A;   LEFT HEART CATH AND CORONARY ANGIOGRAPHY N/A 12/14/2019   Procedure: LEFT HEART CATH AND CORONARY ANGIOGRAPHY;  Surgeon: Burnell Blanks, MD;  Location: [place] [facility];  Service: Cardiovascular;  Laterality: N/A;   POLYPECTOMY  01/09/2019   Procedure: POLYPECTOMY;  Surgeon: Jerene Bears, MD;  Location: [facility];  Service: Gastroenterology;;   TONSILLECTOMY     WISDOM TOOTH EXTRACTION      Current Medications: Current Meds  Medication Sig   ACCU-CHEK SOFTCLIX LANCETS lancets 1 each by Misc.(Non-Drug; Combo Route) route daily.   amitriptyline (ELAVIL) 25 MG tablet Take 25 mg by mouth at bedtime as needed for sleep.    aspirin 81 MG chewable tablet Chew 1 tablet (81 mg total) by mouth daily.   atorvastatin (LIPITOR) 80 MG tablet Take 1 tablet (80 mg total) by mouth daily at 6 PM.   Blood Glucose Monitoring Suppl (GLUCOCOM BLOOD GLUCOSE MONITOR) DEVI 1 each by Misc.(Non-Drug; Combo Route) route daily. Include strips. Lancets, lancet device, control solution. batteries   cholecalciferol (VITAMIN D3) 25 MCG (1000 UT) tablet Take 1,000 Units by mouth daily.   dapagliflozin propanediol (FARXIGA) 5 MG TABS tablet Take 1 tablet (  5 mg total) by mouth daily before breakfast.   ezetimibe (ZETIA) 10 MG tablet Take 1 tablet (10 mg total) by mouth daily.   fluconazole (DIFLUCAN) 150 MG tablet Take 150 mg by mouth once.   glucose blood test strip 1 each by Misc.(Non-Drug; Combo Route) route daily.   latanoprost (XALATAN) 0.005 % ophthalmic solution 1 drop at bedtime.   metFORMIN (GLUCOPHAGE-XR) 500 MG 24 hr tablet Take 500 mg by mouth daily with breakfast. Per patient taking 2 tablets in the morning and  1 tablet in the evening   metoprolol tartrate (LOPRESSOR) 25 MG  tablet Take 1 tablet (25 mg total) by mouth 2 (two) times daily.   nitroGLYCERIN (NITROSTAT) 0.4 MG SL tablet Place 1 tablet (0.4 mg total) under the tongue every 5 (five) minutes x 3 doses as needed for chest pain.   NON FORMULARY Place 1 drop into both eyes daily.   nystatin (MYCOSTATIN/NYSTOP) powder Apply 1 Bottle topically daily as needed (irritation).    omeprazole (PRILOSEC) 40 MG capsule Take 1 capsule by mouth daily.   valsartan (DIOVAN) 80 MG tablet Take 80 mg by mouth daily.    vitamin C (ASCORBIC ACID) 500 MG tablet Take 500 mg by mouth daily.   [DISCONTINUED] ticagrelor (BRILINTA) 90 MG TABS tablet Take 1 tablet (90 mg total) by mouth 2 (two) times daily.     Allergies:   Fenofibrate and Tylox [oxycodone-acetaminophen]   Social History   Socioeconomic History   Marital status: Divorced    Spouse name: Not on file   Number of children: Not on file   Years of education: Not on file   Highest education level: Not on file  Occupational History   Not on file  Tobacco Use   Smoking status: Former    Types: Cigarettes    Quit date: 07/21/2005    Years since quitting: 15.8   Smokeless tobacco: Never   Tobacco comments:    quit 2008  Substance and Sexual Activity   Alcohol use: Yes    Alcohol/week: 0.0 standard drinks    Comment: occasionally   Drug use: No   Sexual activity: Yes    Birth control/protection: I.U.D.  Other Topics Concern   Not on file  Social History Narrative   Not on file   Social Determinants of Health   Financial Resource Strain: Not on file  Food Insecurity: Not on file  Transportation Needs: Not on file  Physical Activity: Not on file  Stress: Not on file  Social Connections: Not on file     Family History: The patient's family history includes ADD / ADHD in her son; Alcoholism in her brother; Aneurysm in her maternal grandmother; Bipolar disorder in her sister; CVA in her mother; Cerebral aneurysm in her mother; Clotting disorder in her  mother; Mental illness in her sister; Throat cancer in her maternal grandfather. There is no history of Colon cancer, Breast cancer, Esophageal cancer, Rectal cancer, or Stomach cancer.  ROS:   Please see the history of present illness.     All other systems reviewed and are negative.  EKGs/Labs/Other Studies Reviewed:    The following studies were reviewed today:  EKG:  EKG is  ordered today.  The ekg ordered today demonstrates  06/05/21: SR rate 80 WNL  Transthoracic Echocardiogram: Date: 12/14/2019 Results: 1. Overall EF preserved small area of inferior basal hypokinesis . Left  ventricular ejection fraction, by estimation, is 55 to 60%. The left  ventricle has normal function. The  left ventricle demonstrates regional  wall motion abnormalities (see scoring  diagram/findings for description). There is severely increased left  ventricular hypertrophy. Left ventricular diastolic parameters are  consistent with Grade I diastolic dysfunction (impaired relaxation).   2. Right ventricular systolic function is moderately reduced. The right  ventricular size is moderately enlarged.   3. The mitral valve is normal in structure and function. No evidence of  mitral valve regurgitation. No evidence of mitral stenosis.   4. The aortic valve is tricuspid. Aortic valve regurgitation is not  visualized. Mild to moderate aortic valve sclerosis/calcification is  present, without any evidence of aortic stenosis.   NonCardiac CT: Date: 09/27/2020 Results: Aortic Atherosclerosis and RCR CAC (s/p PCI)  Left/Right Heart Catheterizations: Date: 12/14/2019 Results: Prox RCA-1 lesion is 30% stenosed. Prox RCA-2 lesion is 100% stenosed. Mid Cx to Dist Cx lesion is 30% stenosed. Dist LAD lesion is 30% stenosed. A drug-eluting stent was successfully placed using a SYNERGY XD 3.0X16. Post intervention, there is a 0% residual stenosis.   1. Inferior STEMI secondary to thrombotic occlusion of the mid  RCA 2. Successful PTCA/DES x 1 mid RCA 3. Mild non-obstructive disease in the proximal RCA, distal Circumflex and distal LAD 4. Elevated LVEDP   Recommendations: Will continue Aggrastat drip for 12 hours given heavy thrombus burden within the stented segment. Continue ASA and Brilinta for one year. Echo later today. High intensity statin. Beta blocker as heart rate tolerates.   Recent Labs: 09/27/2020: ALT 21; BUN 16; Creatinine, Ser 0.59; Hemoglobin 13.4; Platelets 281; Potassium 3.4; Sodium 140  Recent Lipid Panel    Component Value Date/Time   CHOL 87 (L) 05/27/2020 0905   TRIG 93 05/27/2020 0905   HDL 33 (L) 05/27/2020 0905   CHOLHDL 2.6 05/27/2020 0905   CHOLHDL 4.4 12/13/2019 2149   VLDL 37 12/13/2019 2149   LDLCALC 36 05/27/2020 0905        Physical Exam:    VS:  BP 120/80   Pulse 80   Ht '5\' 6"'$  (1.676 m)   Wt (!) 307 lb 9.6 oz (139.5 kg)   SpO2 97%   BMI 49.65 kg/m     Wt Readings from Last 3 Encounters:  06/05/21 (!) 307 lb 9.6 oz (139.5 kg)  09/27/20 (!) 313 lb (142 kg)  06/20/20 (!) 321 lb (145.6 kg)     GEN: Obese well developed in no acute distress HEENT: Normal NECK: No JVD; No carotid bruits LYMPHATICS: No lymphadenopathy CARDIAC: RRR, no murmurs, rubs, gallops; distant heart sounds RESPIRATORY:  Clear to auscultation without rales, wheezing or rhonchi  ABDOMEN: Soft, non-tender, non-distended MUSCULOSKELETAL:  Non pitting edema bilaterally; No deformity  SKIN: Warm and dry; slight leg discoloration, no raised, slightly yellowish hue NEUROLOGIC:  Alert and oriented x 3 PSYCHIATRIC:  Normal affect   ASSESSMENT:    1. Coronary artery disease of native artery of native heart with stable angina pectoris (HCC)   2. Obesity, diabetes, and hypertension syndrome ([place])   3. Aortic atherosclerosis ([place])   4. Mixed hyperlipidemia   5. Former smoker   6. Obstructive apnea    PLAN:    Exertional Night Sweats and weight gain Coronary Artery Disease;  Obstructive Former Tobacco Abuse HLD and Aortic Atherosclerosis Morbid Obesity HTN with DM - asymptomatic  - anatomy: s/p pRCA PCI - continue ASA 81 mg; Will DC Brilinta today - On lipitor 80 mg PO daily On Zetia goal LDL < 70 - continue BB - continue PRN nitrates; as  needed  PDEi - continue ARB - TSH WNL for her night sweats; we will pursue CPET to better evaluate her exercise intolerance - will get echo  Morbid Obesity HTN with DM OSA  CPAP- Patient deferred eval to subsequent visits  Queries of disability - will defer to either primary or occupational health as appropriate  Shared Decision Making/Informed Consent The risks [chest pain, shortness of breath, cardiac arrhythmias, dizziness, blood pressure fluctuations, myocardial infarction, stroke/transient ischemic attack, and life-threatening complications (estimated to be 1 in 10,000)], benefits (risk stratification, diagnosing coronary artery disease, treatment guidance) and alternatives of an cardiopulmonary exercise test were discussed in detail with Ms. Gahn and she agrees to proceed.    Medication Adjustments/Labs and Tests Ordered: Current medicines are reviewed at length with the patient today.  Concerns regarding medicines are outlined above.  Orders Placed This Encounter  Procedures   Cardiopulmonary exercise test   EKG 12-Lead   ECHOCARDIOGRAM COMPLETE    No orders of the defined types were placed in this encounter.   Patient Instructions  Medication Instructions:  Your physician has recommended you make the following change in your medication: Stop Brilinta  *If you need a refill on your cardiac medications before your next appointment, please call your pharmacy*   Lab Work: none If you have labs (blood work) drawn today and your tests are completely normal, you will receive your results only by: [place] (if you have MyChart) OR A paper copy in the mail If you have any lab test that is abnormal  or we need to change your treatment, we will call you to review the results.   Testing/Procedures: Your physician has requested that you have an echocardiogram. Echocardiography is a painless test that uses sound waves to create images of your heart. It provides your doctor with information about the size and shape of your heart and how well your heart's chambers and valves are working. This procedure takes approximately one hour. There are no restrictions for this procedure.  Your physician has recommended that you have a cardiopulmonary stress test (CPX). CPX testing is a non-invasive measurement of heart and lung function. It replaces a traditional treadmill stress test. This type of test provides a tremendous amount of information that relates not only to your present condition but also for future outcomes. This test combines measurements of you ventilation, respiratory gas exchange in the lungs, electrocardiogram (EKG), blood pressure and physical response before, during, and following an exercise protocol.    Follow-Up: At [facility], you and your health needs are our priority.  As part of our continuing mission to provide you with exceptional heart care, we have created designated Provider Care Teams.  These Care Teams include your primary Cardiologist (physician) and Advanced Practice Providers (APPs -  Physician Assistants and Nurse Practitioners) who all work together to provide you with the care you need, when you need it.  We recommend signing up for the patient portal called "MyChart".  Sign up information is provided on this After Visit Summary.  MyChart is used to connect with patients for Virtual Visits (Telemedicine).  Patients are able to view lab/test results, encounter notes, upcoming appointments, etc.  Non-urgent messages can be sent to your provider as well.   To learn more about what you can do with MyChart, go to [email or web address].    Your next appointment:   4 - 5  month(s)  The format for your next appointment:   In Person  Provider:   You  may see Werner Lean, MD or one of the following Advanced Practice Providers on your designated Care Team:   Melina Copa, PA-C Ermalinda Barrios, PA-C   Other Instructions    Signed, Werner Lean, MD  06/05/2021 9:20 AM    [place]

## 2021-06-05 NOTE — Addendum Note (Signed)
Addended by: Rudean Haskell A on: 06/05/2021 09:21 AM   Modules accepted: Orders

## 2021-06-05 NOTE — Patient Instructions (Signed)
Medication Instructions:  Your physician has recommended you make the following change in your medication: Stop Brilinta  *If you need a refill on your cardiac medications before your next appointment, please call your pharmacy*   Lab Work: none If you have labs (blood work) drawn today and your tests are completely normal, you will receive your results only by: [place] (if you have MyChart) OR A paper copy in the mail If you have any lab test that is abnormal or we need to change your treatment, we will call you to review the results.   Testing/Procedures: Your physician has requested that you have an echocardiogram. Echocardiography is a painless test that uses sound waves to create images of your heart. It provides your doctor with information about the size and shape of your heart and how well your heart's chambers and valves are working. This procedure takes approximately one hour. There are no restrictions for this procedure.  Your physician has recommended that you have a cardiopulmonary stress test (CPX). CPX testing is a non-invasive measurement of heart and lung function. It replaces a traditional treadmill stress test. This type of test provides a tremendous amount of information that relates not only to your present condition but also for future outcomes. This test combines measurements of you ventilation, respiratory gas exchange in the lungs, electrocardiogram (EKG), blood pressure and physical response before, during, and following an exercise protocol.    Follow-Up: At [facility], you and your health needs are our priority.  As part of our continuing mission to provide you with exceptional heart care, we have created designated Provider Care Teams.  These Care Teams include your primary Cardiologist (physician) and Advanced Practice Providers (APPs -  Physician Assistants and Nurse Practitioners) who all work together to provide you with the care you need, when you  need it.  We recommend signing up for the patient portal called "MyChart".  Sign up information is provided on this After Visit Summary.  MyChart is used to connect with patients for Virtual Visits (Telemedicine).  Patients are able to view lab/test results, encounter notes, upcoming appointments, etc.  Non-urgent messages can be sent to your provider as well.   To learn more about what you can do with MyChart, go to [email or web address].    Your next appointment:   4 - 5 month(s)  The format for your next appointment:   In Person  Provider:   You may see Werner Lean, MD or one of the following Advanced Practice Providers on your designated Care Team:   Melina Copa, PA-C Ermalinda Barrios, PA-C   Other Instructions

## 2021-06-22 ENCOUNTER — Other Ambulatory Visit: Payer: Self-pay

## 2021-06-22 ENCOUNTER — Ambulatory Visit (HOSPITAL_COMMUNITY): Payer: Medicaid Other | Attending: Cardiology

## 2021-06-22 DIAGNOSIS — I25118 Atherosclerotic heart disease of native coronary artery with other forms of angina pectoris: Secondary | ICD-10-CM | POA: Diagnosis not present

## 2021-06-22 LAB — ECHOCARDIOGRAM COMPLETE
Area-P 1/2: 3.42 cm2
P 1/2 time: 467 msec
S' Lateral: 2.2 cm

## 2021-06-25 ENCOUNTER — Other Ambulatory Visit: Payer: Self-pay

## 2021-06-25 ENCOUNTER — Ambulatory Visit (HOSPITAL_COMMUNITY): Payer: Medicaid Other | Attending: Internal Medicine

## 2021-06-25 DIAGNOSIS — I25118 Atherosclerotic heart disease of native coronary artery with other forms of angina pectoris: Secondary | ICD-10-CM | POA: Insufficient documentation

## 2021-06-25 DIAGNOSIS — R0600 Dyspnea, unspecified: Secondary | ICD-10-CM | POA: Diagnosis not present

## 2021-06-25 DIAGNOSIS — I2510 Atherosclerotic heart disease of native coronary artery without angina pectoris: Secondary | ICD-10-CM | POA: Diagnosis not present

## 2021-07-03 ENCOUNTER — Telehealth: Payer: Self-pay | Admitting: Internal Medicine

## 2021-07-03 NOTE — Telephone Encounter (Signed)
Results from [place]: At next visit with discuss Carotid Artery Duplex Will discuss PC PA start of Brilinta with patient.

## 2021-07-14 ENCOUNTER — Ambulatory Visit: Payer: Medicaid Other | Admitting: Podiatry

## 2021-10-11 NOTE — Progress Notes (Signed)
Cardiology Office Note:    Date:  10/12/2021   ID:  Kristin Reilly, DOB 1966-01-14, MRN [id number]  PCP:  Berkley Harvey, NP   [facility] Providers Cardiologist:  Werner Lean, MD     Referring MD: Berkley Harvey, NP   CC:  Transition to new cardiologist  History of Present Illness:    Kristin Reilly is a 55 y.o. adult with a hx of Morbid Obesity HTN with DM, OSA not on  CPAP, CAD with history of inferior MI, HLD, former smoker; who presents for evaluation 06/05/21.  In notes from [facility] there were concerns of Carotid Artery Disease and Duplex was over.  Ha stable CPET, Echo WNL, Seen 10/12/21.  Patient notes that she is doing better since eval.  Notes her exercise tolerance is about the same.   There are no interval hospital/ED visit.    No chest pain or pressure.  No SOB and no PND/Orthopnea.  Still have some labored breath.  No weight gain, notes persistent L leg swelling for years.  No palpitations or syncope.  Post Cardiac rehab she still have some residual week.   Ambulatory blood pressure not done.  Hasn't taken her medications yet (taken at 9 am)    Past Medical History:  Diagnosis Date   Arthritis    Depression    Glaucoma    Hyperlipidemia    Hypertension    Sleep apnea    no cpap    Past Surgical History:  Procedure Laterality Date   APPENDECTOMY     COLONOSCOPY N/A 08/05/2015   Procedure: COLONOSCOPY;  Surgeon: Jerene Bears, MD;  Location: [facility];  Service: Gastroenterology;  Laterality: N/A;   COLONOSCOPY WITH PROPOFOL N/A 01/09/2019   Procedure: COLONOSCOPY WITH PROPOFOL;  Surgeon: Jerene Bears, MD;  Location: [facility];  Service: Gastroenterology;  Laterality: N/A;   CORONARY STENT INTERVENTION N/A 12/14/2019   Procedure: CORONARY STENT INTERVENTION;  Surgeon: Burnell Blanks, MD;  Location: [place] [facility];  Service: Cardiovascular;  Laterality: N/A;   CORONARY/GRAFT ACUTE MI REVASCULARIZATION N/A 12/14/2019    Procedure: Coronary/Graft Acute MI Revascularization;  Surgeon: Burnell Blanks, MD;  Location: [place] [facility];  Service: Cardiovascular;  Laterality: N/A;   LEFT HEART CATH AND CORONARY ANGIOGRAPHY N/A 12/14/2019   Procedure: LEFT HEART CATH AND CORONARY ANGIOGRAPHY;  Surgeon: Burnell Blanks, MD;  Location: [place] [facility];  Service: Cardiovascular;  Laterality: N/A;   POLYPECTOMY  01/09/2019   Procedure: POLYPECTOMY;  Surgeon: Jerene Bears, MD;  Location: [facility];  Service: Gastroenterology;;   TONSILLECTOMY     WISDOM TOOTH EXTRACTION      Current Medications: Current Meds  Medication Sig   ACCU-CHEK SOFTCLIX LANCETS lancets 1 each by Misc.(Non-Drug; Combo Route) route daily.   amitriptyline (ELAVIL) 25 MG tablet Take 25 mg by mouth at bedtime as needed for sleep.    aspirin 81 MG chewable tablet Chew 1 tablet (81 mg total) by mouth daily.   atorvastatin (LIPITOR) 80 MG tablet Take 1 tablet (80 mg total) by mouth daily at 6 PM.   Blood Glucose Monitoring Suppl (GLUCOCOM BLOOD GLUCOSE MONITOR) DEVI 1 each by Misc.(Non-Drug; Combo Route) route daily. Include strips. Lancets, lancet device, control solution. batteries   cholecalciferol (VITAMIN D3) 25 MCG (1000 UT) tablet Take 1,000 Units by mouth daily.   cyanocobalamin 1000 MCG tablet Take by mouth daily.   dapagliflozin propanediol (FARXIGA) 5 MG TABS tablet Take 1  tablet (5 mg total) by mouth daily before breakfast.   ezetimibe (ZETIA) 10 MG tablet Take 1 tablet (10 mg total) by mouth daily.   fluconazole (DIFLUCAN) 150 MG tablet Take 150 mg by mouth once.   glucose blood test strip 1 each by Misc.(Non-Drug; Combo Route) route daily.   latanoprost (XALATAN) 0.005 % ophthalmic solution 1 drop at bedtime.   Liraglutide (VICTOZA [place]) Inject 1.8 mg into the skin daily.   metFORMIN (GLUCOPHAGE-XR) 500 MG 24 hr tablet Take 500 mg by mouth daily with breakfast. Per patient taking 2 tablets in the morning and  1 tablet  in the evening   metoprolol tartrate (LOPRESSOR) 25 MG tablet Take 1 tablet (25 mg total) by mouth 2 (two) times daily.   nitroGLYCERIN (NITROSTAT) 0.4 MG SL tablet Place 1 tablet (0.4 mg total) under the tongue every 5 (five) minutes x 3 doses as needed for chest pain.   NON FORMULARY Place 1 drop into both eyes daily.   nystatin (MYCOSTATIN/NYSTOP) powder Apply 1 Bottle topically daily as needed (irritation).    omeprazole (PRILOSEC) 40 MG capsule Take 1 capsule by mouth daily.   valsartan (DIOVAN) 80 MG tablet Take 80 mg by mouth daily.    VICTOZA 18 MG/3ML SOPN Inject 1.8 mg into the skin daily.   vitamin C (ASCORBIC ACID) 500 MG tablet Take 500 mg by mouth daily.     Allergies:   Fenofibrate and Tylox [oxycodone-acetaminophen]   Social History   Socioeconomic History   Marital status: Divorced    Spouse name: Not on file   Number of children: Not on file   Years of education: Not on file   Highest education level: Not on file  Occupational History   Not on file  Tobacco Use   Smoking status: Former    Types: Cigarettes    Quit date: 07/21/2005    Years since quitting: 16.2   Smokeless tobacco: Never   Tobacco comments:    quit 2008  Substance and Sexual Activity   Alcohol use: Yes    Alcohol/week: 0.0 standard drinks    Comment: occasionally   Drug use: No   Sexual activity: Yes    Birth control/protection: I.U.D.  Other Topics Concern   Not on file  Social History Narrative   Not on file   Social Determinants of Health   Financial Resource Strain: Not on file  Food Insecurity: Not on file  Transportation Needs: Not on file  Physical Activity: Not on file  Stress: Not on file  Social Connections: Not on file     Family History: The patient's family history includes ADD / ADHD in her son; Alcoholism in her brother; Aneurysm in her maternal grandmother; Bipolar disorder in her sister; CVA in her mother; Cerebral aneurysm in her mother; Clotting disorder in her  mother; Mental illness in her sister; Throat cancer in her maternal grandfather. There is no history of Colon cancer, Breast cancer, Esophageal cancer, Rectal cancer, or Stomach cancer.  ROS:   Please see the history of present illness.     All other systems reviewed and are negative.  EKGs/Labs/Other Studies Reviewed:    The following studies were reviewed today:  EKG:   06/05/21: SR rate 80 WNL  Transthoracic Echocardiogram: Date: 06/22/21 Results:  1. Left ventricular ejection fraction, by estimation, is >75%. The left  ventricle has hyperdynamic function. The left ventricle has no regional  wall motion abnormalities. Left ventricular diastolic function could not  be evaluated.  2. Right ventricular systolic function is normal. The right ventricular  size is normal. There is normal pulmonary artery systolic pressure.   3. The mitral valve is normal in structure. No evidence of mitral valve  regurgitation.   4. The aortic valve is normal in structure. Aortic valve regurgitation is  trivial.   5. Aortic  is ULN  6. The inferior vena cava is normal in size with greater than 50%  respiratory variability, suggesting right atrial pressure of 3 mmHg.   NonCardiac CT: Date: 09/27/2020 Results: Aortic Atherosclerosis and RCR CAC (s/p PCI)  CPET Date 06/25/21: Results:  CPX:  Exercise testing with gas exchange demonstrates a normal peak VO2 of 15.0 ml/kg/min (101% of the age/gender/weight matched sedentary norms). The RER of 1.07 indicates a near maximal effort. When adjusted to the patient's ideal body weight of 145.9 lb (66.2 kg) the peak VO2 is 32.2 ml/kg (ibw)/min (133% of the ibw-adjusted predicted). The VE/VCO2 slope is normal. The oxygen uptake efficiency slope (OUES) is normal. The VO2 at the ventilatory threshold was normal at 79% of the predicted peak VO2. At peak exercise, the ventilation reached 73% of the measured MVV indicating ventilatory reserve remained. The O2pulse (a  surrogate for stroke volume) increased with incremental exercise, reaching peak at 16 ml/beat (125% predicted).   Conclusion: Exercise testing with gas exchange demonstrates normal functional capacity when compared to matched sedentary norms. There is no cardiopulmonary abnormality. Patient is limited due to body habitus.    Test, report and preliminary impression by:  Kathy Breach, MS, ACSM-RCEP  06/25/2021 3:37 PM   Attending: Agree with above. No significant cardiopulmonary limitation noted. Patient limited by her body habitus. When looking at weight-corrected pVO2 patient actually has a good deal of cardiopulmonary fitness masked by her obesity,.   Left/Right Heart Catheterizations: Date: 12/14/2019 Results: Prox RCA-1 lesion is 30% stenosed. Prox RCA-2 lesion is 100% stenosed. Mid Cx to Dist Cx lesion is 30% stenosed. Dist LAD lesion is 30% stenosed. A drug-eluting stent was successfully placed using a SYNERGY XD 3.0X16. Post intervention, there is a 0% residual stenosis.   1. Inferior STEMI secondary to thrombotic occlusion of the mid RCA 2. Successful PTCA/DES x 1 mid RCA 3. Mild non-obstructive disease in the proximal RCA, distal Circumflex and distal LAD 4. Elevated LVEDP   Recommendations: Will continue Aggrastat drip for 12 hours given heavy thrombus burden within the stented segment. Continue ASA and Brilinta for one year. Echo later today. High intensity statin. Beta blocker as heart rate tolerates.   Recent Labs: No results found for requested labs within last 8760 hours.  Recent Lipid Panel    Component Value Date/Time   CHOL 87 (L) 05/27/2020 0905   TRIG 93 05/27/2020 0905   HDL 33 (L) 05/27/2020 0905   CHOLHDL 2.6 05/27/2020 0905   CHOLHDL 4.4 12/13/2019 2149   VLDL 37 12/13/2019 2149   LDLCALC 36 05/27/2020 0905        Physical Exam:    VS:  BP (!) 152/82   Pulse 87   Ht 5\' 6"  (1.676 m)   Wt (!) 313 lb 12.8 oz (142.3 kg)   SpO2 97%   BMI  50.65 kg/m     Wt Readings from Last 3 Encounters:  10/12/21 (!) 313 lb 12.8 oz (142.3 kg)  06/05/21 (!) 307 lb 9.6 oz (139.5 kg)  09/27/20 (!) 313 lb (142 kg)     Gen: No distress, Morbid obesity   Neck: No JVD Cardiac:  No Rubs or Gallops, no Murmur, normal rhythm +2 radial pulses Respiratory: Clear to auscultation bilaterally, normal effort, normal  respiratory rate GI: Soft, nontender, non-distended  MS: No  edema;  moves all extremities Integument: Skin feels warm Neuro:  At time of evaluation, alert and oriented to person/place/time/situation  Psych: Normal affect, patient feels OK   ASSESSMENT:    1. Coronary artery disease of native artery of native heart with stable angina pectoris (HCC)   2. Obesity, diabetes, and hypertension syndrome ([place])   3. Aortic atherosclerosis ([place])   4. OSA (obstructive sleep apnea)     PLAN:    Coronary Artery Disease; Obstructive Former Tobacco Abuse HLD and Aortic Atherosclerosis Morbid Obesity HTN with DM - asymptomatic  - anatomy: s/p pRCA PCI - continue ASA 81 mg  - On lipitor 80 mg PO daily On Zetia goal LDL < 55  (last 30s) - continue BB tartrate 25 mg PO BID - continue PRN nitrates None need - continue ARB 80 mg Diovan - ambulatory BP will be state - will refer to health and wellness for exercise program  OSA  CPAP - STOPBANG 7 - deferred sleep study presently  Will plan for 6 months follow up unless new symptoms or abnormal test results warranting change in plan  Would be reasonable for me or APP Follow up   Medication Adjustments/Labs and Tests Ordered: Current medicines are reviewed at length with the patient today.  Concerns regarding medicines are outlined above.  Orders Placed This Encounter  Procedures   Ambulatory referral to [facility]     No orders of the defined types were placed in this encounter.   Patient Instructions  Medication Instructions:  Your physician recommends  that you continue on your current medications as directed. Please refer to the Current Medication list given to you today.  *If you need a refill on your cardiac medications before your next appointment, please call your pharmacy*   Lab Work: NONE If you have labs (blood work) drawn today and your tests are completely normal, you will receive your results only by: [place] (if you have MyChart) OR A paper copy in the mail If you have any lab test that is abnormal or we need to change your treatment, we will call you to review the results.   Testing/Procedures: Your physician has referred you to [facility].   Follow-Up: At [facility], you and your health needs are our priority.  As part of our continuing mission to provide you with exceptional heart care, we have created designated Provider Care Teams.  These Care Teams include your primary Cardiologist (physician) and Advanced Practice Providers (APPs -  Physician Assistants and Nurse Practitioners) who all work together to provide you with the care you need, when you need it.   Your next appointment:   6 month(s)  The format for your next appointment:   In Person  Provider:   Werner Lean, MD        Signed, Werner Lean, MD  10/12/2021 8:47 AM    [place]

## 2021-10-12 ENCOUNTER — Other Ambulatory Visit: Payer: Self-pay

## 2021-10-12 ENCOUNTER — Encounter: Payer: Self-pay | Admitting: Internal Medicine

## 2021-10-12 ENCOUNTER — Ambulatory Visit (INDEPENDENT_AMBULATORY_CARE_PROVIDER_SITE_OTHER): Payer: Medicaid Other | Admitting: Internal Medicine

## 2021-10-12 VITALS — BP 152/82 | HR 87 | Ht 66.0 in | Wt 313.8 lb

## 2021-10-12 DIAGNOSIS — G4733 Obstructive sleep apnea (adult) (pediatric): Secondary | ICD-10-CM | POA: Diagnosis not present

## 2021-10-12 DIAGNOSIS — I25118 Atherosclerotic heart disease of native coronary artery with other forms of angina pectoris: Secondary | ICD-10-CM | POA: Diagnosis not present

## 2021-10-12 DIAGNOSIS — I152 Hypertension secondary to endocrine disorders: Secondary | ICD-10-CM

## 2021-10-12 DIAGNOSIS — E1159 Type 2 diabetes mellitus with other circulatory complications: Secondary | ICD-10-CM

## 2021-10-12 DIAGNOSIS — E669 Obesity, unspecified: Secondary | ICD-10-CM

## 2021-10-12 DIAGNOSIS — E1169 Type 2 diabetes mellitus with other specified complication: Secondary | ICD-10-CM | POA: Diagnosis not present

## 2021-10-12 DIAGNOSIS — I700 Atherosclerosis of aorta: Secondary | ICD-10-CM | POA: Diagnosis not present

## 2021-10-12 NOTE — Patient Instructions (Signed)
Medication Instructions:  Your physician recommends that you continue on your current medications as directed. Please refer to the Current Medication list given to you today.  *If you need a refill on your cardiac medications before your next appointment, please call your pharmacy*   Lab Work: NONE If you have labs (blood work) drawn today and your tests are completely normal, you will receive your results only by: [place] (if you have MyChart) OR A paper copy in the mail If you have any lab test that is abnormal or we need to change your treatment, we will call you to review the results.   Testing/Procedures: Your physician has referred you to [facility].   Follow-Up: At [facility], you and your health needs are our priority.  As part of our continuing mission to provide you with exceptional heart care, we have created designated Provider Care Teams.  These Care Teams include your primary Cardiologist (physician) and Advanced Practice Providers (APPs -  Physician Assistants and Nurse Practitioners) who all work together to provide you with the care you need, when you need it.   Your next appointment:   6 month(s)  The format for your next appointment:   In Person  Provider:   Werner Lean, MD

## 2022-04-03 NOTE — Progress Notes (Signed)
Cardiology Office Note:    Date:  04/05/2022   ID:  Kristin Reilly, DOB 1966/05/02, MRN [id number]  PCP:  Berkley Harvey, NP   [facility] Providers Cardiologist:  Werner Lean, MD     Referring MD: Berkley Harvey, NP   CC:  Transition to new cardiologist  History of Present Illness:    Kristin Reilly is a 56 y.o. adult with a hx of Morbid Obesity HTN with DM, OSA not on CPAP, CAD with history of inferior MI, HLD, former smoker; who presents for evaluation 06/05/21.   2022: Had stable CPET, Echo WNL, referred to healthy weight and wellness.  Patient notes that she is doing alright.   Since last visit notes that she doesn't think [facility] is a fit due to financial constraints and distance to  There are no interval hospital/ED visit.    No chest pain or pressure .  No SOB/DOE and no PND/Orthopnea.  No weight gain (her scale at her PCP)  or leg swelling.  No palpitations or syncope.  Since interval has had one episode of helping a woman; has nausea and vomiting after eating an apple and didn't stop vomiting until everything was out of her system.  At her [place] at her PCP she has lost 7 lbs (307 lbs).   Past Medical History:  Diagnosis Date   Arthritis    Depression    Glaucoma    Hyperlipidemia    Hypertension    Sleep apnea    no cpap    Past Surgical History:  Procedure Laterality Date   APPENDECTOMY     COLONOSCOPY N/A 08/05/2015   Procedure: COLONOSCOPY;  Surgeon: Jerene Bears, MD;  Location: [facility];  Service: Gastroenterology;  Laterality: N/A;   COLONOSCOPY WITH PROPOFOL N/A 01/09/2019   Procedure: COLONOSCOPY WITH PROPOFOL;  Surgeon: Jerene Bears, MD;  Location: [facility];  Service: Gastroenterology;  Laterality: N/A;   CORONARY STENT INTERVENTION N/A 12/14/2019   Procedure: CORONARY STENT INTERVENTION;  Surgeon: Burnell Blanks, MD;  Location: [place] [facility];  Service: Cardiovascular;  Laterality:  N/A;   CORONARY/GRAFT ACUTE MI REVASCULARIZATION N/A 12/14/2019   Procedure: Coronary/Graft Acute MI Revascularization;  Surgeon: Burnell Blanks, MD;  Location: [place] [facility];  Service: Cardiovascular;  Laterality: N/A;   LEFT HEART CATH AND CORONARY ANGIOGRAPHY N/A 12/14/2019   Procedure: LEFT HEART CATH AND CORONARY ANGIOGRAPHY;  Surgeon: Burnell Blanks, MD;  Location: [place] [facility];  Service: Cardiovascular;  Laterality: N/A;   POLYPECTOMY  01/09/2019   Procedure: POLYPECTOMY;  Surgeon: Jerene Bears, MD;  Location: [facility];  Service: Gastroenterology;;   TONSILLECTOMY     WISDOM TOOTH EXTRACTION      Current Medications: Current Meds  Medication Sig   ACCU-CHEK SOFTCLIX LANCETS lancets 1 each by Misc.(Non-Drug; Combo Route) route daily.   amitriptyline (ELAVIL) 25 MG tablet Take 25 mg by mouth at bedtime as needed for sleep.    aspirin 81 MG chewable tablet Chew 1 tablet (81 mg total) by mouth daily.   atorvastatin (LIPITOR) 80 MG tablet Take 1 tablet (80 mg total) by mouth daily at 6 PM.   Blood Glucose Monitoring Suppl (GLUCOCOM BLOOD GLUCOSE MONITOR) DEVI 1 each by Misc.(Non-Drug; Combo Route) route daily. Include strips. Lancets, lancet device, control solution. batteries   cholecalciferol (VITAMIN D3) 25 MCG (1000 UT) tablet Take 1,000 Units by mouth daily.   cyanocobalamin 1000 MCG tablet Take  by mouth daily.   ezetimibe (ZETIA) 10 MG tablet Take 1 tablet (10 mg total) by mouth daily.   glucose blood test strip 1 each by Misc.(Non-Drug; Combo Route) route daily.   latanoprost (XALATAN) 0.005 % ophthalmic solution 1 drop at bedtime.   metFORMIN (GLUCOPHAGE-XR) 500 MG 24 hr tablet Take 500 mg by mouth daily with breakfast. Per patient taking 2 tablets in the morning and  1 tablet in the evening   metoprolol tartrate (LOPRESSOR) 25 MG tablet Take 1 tablet (25 mg total) by mouth 2 (two) times daily.   NON FORMULARY Place 1 drop into both eyes daily.    nystatin (MYCOSTATIN/NYSTOP) powder Apply 1 Bottle topically daily as needed (irritation).    omeprazole (PRILOSEC) 40 MG capsule Take 1 capsule by mouth daily.   TRULICITY 3.01 SW/1.0XN SOPN once a week.   valsartan (DIOVAN) 80 MG tablet Take 80 mg by mouth daily.    vitamin C (ASCORBIC ACID) 500 MG tablet Take 500 mg by mouth daily.   [DISCONTINUED] nitroGLYCERIN (NITROSTAT) 0.4 MG SL tablet Place 1 tablet (0.4 mg total) under the tongue every 5 (five) minutes x 3 doses as needed for chest pain.     Allergies:   Fenofibrate and Tylox [oxycodone-acetaminophen]   Social History   Socioeconomic History   Marital status: Divorced    Spouse name: Not on file   Number of children: Not on file   Years of education: Not on file   Highest education level: Not on file  Occupational History   Not on file  Tobacco Use   Smoking status: Former    Types: Cigarettes    Quit date: 07/21/2005    Years since quitting: 16.7   Smokeless tobacco: Never   Tobacco comments:    quit 2008  Substance and Sexual Activity   Alcohol use: Yes    Alcohol/week: 0.0 standard drinks    Comment: occasionally   Drug use: No   Sexual activity: Yes    Birth control/protection: I.U.D.  Other Topics Concern   Not on file  Social History Narrative   Not on file   Social Determinants of Health   Financial Resource Strain: Not on file  Food Insecurity: Not on file  Transportation Needs: Not on file  Physical Activity: Not on file  Stress: Not on file  Social Connections: Not on file     Family History: The patient's family history includes ADD / ADHD in her son; Alcoholism in her brother; Aneurysm in her maternal grandmother; Bipolar disorder in her sister; CVA in her mother; Cerebral aneurysm in her mother; Clotting disorder in her mother; Mental illness in her sister; Throat cancer in her maternal grandfather. There is no history of Colon cancer, Breast cancer, Esophageal cancer, Rectal cancer, or Stomach  cancer.  ROS:   Please see the history of present illness.     All other systems reviewed and are negative.  EKGs/Labs/Other Studies Reviewed:    The following studies were reviewed today:  EKG:   03/06/22: SR LAE  06/05/21: SR rate 80 WNL  Transthoracic Echocardiogram: Date: 06/22/21 Results:  1. Left ventricular ejection fraction, by estimation, is >75%. The left  ventricle has hyperdynamic function. The left ventricle has no regional  wall motion abnormalities. Left ventricular diastolic function could not  be evaluated.   2. Right ventricular systolic function is normal. The right ventricular  size is normal. There is normal pulmonary artery systolic pressure.   3. The mitral valve is  normal in structure. No evidence of mitral valve  regurgitation.   4. The aortic valve is normal in structure. Aortic valve regurgitation is  trivial.   5. Aortic  is ULN  6. The inferior vena cava is normal in size with greater than 50%  respiratory variability, suggesting right atrial pressure of 3 mmHg.   NonCardiac CT: Date: 09/27/2020 Results: Aortic Atherosclerosis and RCR CAC (s/p PCI)  CPET Date 06/25/21: Results:  CPX:  Exercise testing with gas exchange demonstrates a normal peak VO2 of 15.0 ml/kg/min (101% of the age/gender/weight matched sedentary norms). The RER of 1.07 indicates a near maximal effort. When adjusted to the patient's ideal body weight of 145.9 lb (66.2 kg) the peak VO2 is 32.2 ml/kg (ibw)/min (133% of the ibw-adjusted predicted). The VE/VCO2 slope is normal. The oxygen uptake efficiency slope (OUES) is normal. The VO2 at the ventilatory threshold was normal at 79% of the predicted peak VO2. At peak exercise, the ventilation reached 73% of the measured MVV indicating ventilatory reserve remained. The O2pulse (a surrogate for stroke volume) increased with incremental exercise, reaching peak at 16 ml/beat (125% predicted).   Conclusion: Exercise testing with gas exchange  demonstrates normal functional capacity when compared to matched sedentary norms. There is no cardiopulmonary abnormality. Patient is limited due to body habitus.    Test, report and preliminary impression by:  Kathy Breach, MS, ACSM-RCEP  06/25/2021 3:37 PM   Attending: Agree with above. No significant cardiopulmonary limitation noted. Patient limited by her body habitus. When looking at weight-corrected pVO2 patient actually has a good deal of cardiopulmonary fitness masked by her obesity,.   Left/Right Heart Catheterizations: Date: 12/14/2019 Results: Prox RCA-1 lesion is 30% stenosed. Prox RCA-2 lesion is 100% stenosed. Mid Cx to Dist Cx lesion is 30% stenosed. Dist LAD lesion is 30% stenosed. A drug-eluting stent was successfully placed using a SYNERGY XD 3.0X16. Post intervention, there is a 0% residual stenosis.   1. Inferior STEMI secondary to thrombotic occlusion of the mid RCA 2. Successful PTCA/DES x 1 mid RCA 3. Mild non-obstructive disease in the proximal RCA, distal Circumflex and distal LAD 4. Elevated LVEDP   Recommendations: Will continue Aggrastat drip for 12 hours given heavy thrombus burden within the stented segment. Continue ASA and Brilinta for one year. Echo later today. High intensity statin. Beta blocker as heart rate tolerates.   Recent Labs: No results found for requested labs within last 8760 hours.  Recent Lipid Panel    Component Value Date/Time   CHOL 87 (L) 05/27/2020 0905   TRIG 93 05/27/2020 0905   HDL 33 (L) 05/27/2020 0905   CHOLHDL 2.6 05/27/2020 0905   CHOLHDL 4.4 12/13/2019 2149   VLDL 37 12/13/2019 2149   LDLCALC 36 05/27/2020 0905        Physical Exam:    VS:  BP 130/80   Pulse 78   Ht '5\' 6"'$  (1.676 m)   Wt (!) 312 lb 6.4 oz (141.7 kg)   SpO2 98%   BMI 50.42 kg/m     Wt Readings from Last 3 Encounters:  04/05/22 (!) 312 lb 6.4 oz (141.7 kg)  10/12/21 (!) 313 lb 12.8 oz (142.3 kg)  06/05/21 (!) 307 lb 9.6 oz (139.5  kg)     Gen: No distress, morbid obesity   Neck: No JVD Cardiac: No Rubs or Gallops, no murmur, normal rhythm +2 radial pulses Respiratory: Clear to auscultation bilaterally, normal effort, normal  respiratory rate GI: Soft, nontender, non-distended  MS: No  edema;  moves all extremities Integument: Skin feels warm Neuro:  At time of evaluation, alert and oriented to person/place/time/situation  Psych: Normal affect, patient feels a bit down   ASSESSMENT:    1. Coronary artery disease of native artery of native heart with stable angina pectoris ([place])   2. Former tobacco use   3. Morbid obesity ([place])   4. Diabetes mellitus with coincident hypertension ([place])   5. Hyperlipidemia associated with type 2 diabetes mellitus ([place])   6. Aortic atherosclerosis ([place])   7. OSA (obstructive sleep apnea)     PLAN:    Coronary Artery Disease; Obstructive Former Tobacco Abuse HLD and Aortic Atherosclerosis Morbid Obesity HTN with DM - asymptomatic (her anginal equivalent was fatigue and jaw pain like her teeth was going to find out) - anatomy: s/p pRCA PCI - continue ASA 81 mg  - On lipitor 80 mg PO daily On Zetia 10 mg goal LDL < 55  - continue BB tartrate 25 mg PO BID - continue PRN nitrates (None needed, needs refill, never had headache)  - continue ARB 80 mg Diovan - ambulatory BP will be state  We discuss the pros and cons for testing of microvascular testing; we will pursue this if her jaw pain comes back We discussed graded exercise; she cannot to health weight and wellness and would not wish to pursue [place] 2 miles aware.  Finances are an issue.  We discussed walking as therapy  OSA  CPAP - STOPBANG 7 - deferred sleep study again, she will let us know if she changes her mind  One year me or APP   Medication Adjustments/Labs and Tests Ordered: Current medicines are reviewed at length with the patient today.  Concerns regarding medicines are outlined above.  No  orders of the defined types were placed in this encounter.    Meds ordered this encounter  Medications   nitroGLYCERIN (NITROSTAT) 0.4 MG SL tablet    Sig: Place 1 tablet (0.4 mg total) under the tongue every 5 (five) minutes x 3 doses as needed for chest pain.    Dispense:  25 tablet    Refill:  3     Patient Instructions  Medication Instructions:  Your physician recommends that you continue on your current medications as directed. Please refer to the Current Medication list given to you today. REFILL: Nitroglycerin  *If you need a refill on your cardiac medications before your next appointment, please call your pharmacy*   Lab Work: NONE If you have labs (blood work) drawn today and your tests are completely normal, you will receive your results only by: [place] (if you have MyChart) OR A paper copy in the mail If you have any lab test that is abnormal or we need to change your treatment, we will call you to review the results.   Testing/Procedures: NONE   Follow-Up: At [facility], you and your health needs are our priority.  As part of our continuing mission to provide you with exceptional heart care, we have created designated Provider Care Teams.  These Care Teams include your primary Cardiologist (physician) and Advanced Practice Providers (APPs -  Physician Assistants and Nurse Practitioners) who all work together to provide you with the care you need, when you need it.  Your next appointment:   1 year(s)  The format for your next appointment:   In Person  Provider:   Werner Lean, MD  or Melina Copa, PA-C, Ermalinda Barrios,  PA-C, or Christen Bame, NP        Important Information About Sugar         Signed, Werner Lean, MD  04/05/2022 8:26 AM    [place]

## 2022-04-05 ENCOUNTER — Ambulatory Visit (INDEPENDENT_AMBULATORY_CARE_PROVIDER_SITE_OTHER): Payer: Medicaid Other | Admitting: Internal Medicine

## 2022-04-05 ENCOUNTER — Encounter: Payer: Self-pay | Admitting: Internal Medicine

## 2022-04-05 VITALS — BP 130/80 | HR 78 | Ht 66.0 in | Wt 312.4 lb

## 2022-04-05 DIAGNOSIS — Z87891 Personal history of nicotine dependence: Secondary | ICD-10-CM | POA: Diagnosis not present

## 2022-04-05 DIAGNOSIS — G4733 Obstructive sleep apnea (adult) (pediatric): Secondary | ICD-10-CM

## 2022-04-05 DIAGNOSIS — I10 Essential (primary) hypertension: Secondary | ICD-10-CM

## 2022-04-05 DIAGNOSIS — E119 Type 2 diabetes mellitus without complications: Secondary | ICD-10-CM | POA: Diagnosis not present

## 2022-04-05 DIAGNOSIS — I25118 Atherosclerotic heart disease of native coronary artery with other forms of angina pectoris: Secondary | ICD-10-CM | POA: Diagnosis not present

## 2022-04-05 DIAGNOSIS — I700 Atherosclerosis of aorta: Secondary | ICD-10-CM

## 2022-04-05 DIAGNOSIS — E785 Hyperlipidemia, unspecified: Secondary | ICD-10-CM

## 2022-04-05 DIAGNOSIS — E1169 Type 2 diabetes mellitus with other specified complication: Secondary | ICD-10-CM

## 2022-04-05 MED ORDER — NITROGLYCERIN 0.4 MG SL SUBL: 0.4000 mg | SUBLINGUAL_TABLET | SUBLINGUAL | 3 refills | Status: AC | PRN

## 2022-04-05 NOTE — Patient Instructions (Signed)
Medication Instructions:  Your physician recommends that you continue on your current medications as directed. Please refer to the Current Medication list given to you today. REFILL: Nitroglycerin  *If you need a refill on your cardiac medications before your next appointment, please call your pharmacy*   Lab Work: NONE If you have labs (blood work) drawn today and your tests are completely normal, you will receive your results only by: [place] (if you have MyChart) OR A paper copy in the mail If you have any lab test that is abnormal or we need to change your treatment, we will call you to review the results.   Testing/Procedures: NONE   Follow-Up: At [facility], you and your health needs are our priority.  As part of our continuing mission to provide you with exceptional heart care, we have created designated Provider Care Teams.  These Care Teams include your primary Cardiologist (physician) and Advanced Practice Providers (APPs -  Physician Assistants and Nurse Practitioners) who all work together to provide you with the care you need, when you need it.  Your next appointment:   1 year(s)  The format for your next appointment:   In Person  Provider:   Werner Lean, MD  or Melina Copa, PA-C, Ermalinda Barrios, PA-C, or Christen Bame, NP        Important Information About Sugar

## 2022-04-05 NOTE — Addendum Note (Signed)
Addended by: Jeremy Johann on: 04/05/2022 08:28 AM   Modules accepted: Orders
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
## 2022-11-30 ENCOUNTER — Telehealth: Payer: Self-pay | Admitting: Internal Medicine

## 2022-11-30 NOTE — Telephone Encounter (Signed)
   Pre-operative Risk Assessment    Patient Name: Kristin Reilly  DOB: 12/24/1965 MRN: [id number]     Request for Surgical Clearance    Procedure:  Dental Extraction - Amount of Teeth to be Pulled:  1  Date of Surgery:  Clearance 11/30/21                                 Surgeon:  Dr. Evaristo Bury Group or Practice Name:  [facility] Phone number:  [phone] Fax number:  [phone]   Type of Clearance Requested:   - Medical    Type of Anesthesia:       Additional requests/questions:      SignedMilbert Coulter   11/30/2022, 9:48 AM

## 2022-11-30 NOTE — Telephone Encounter (Signed)
   Primary Cardiologist: Werner Lean, MD  Chart reviewed as part of pre-operative protocol coverage. Simple dental extractions (1-2 teeth) are considered low risk procedures per guidelines and generally do not require any specific cardiac clearance. It is also generally accepted that for simple extractions and dental cleanings, there is no need to interrupt blood thinner therapy. She may have extractions performed under light sedation as deemed appropriate by requesting provider.   SBE prophylaxis is not required for the patient.  I will route this recommendation to the requesting party via Epic fax function and remove from pre-op pool.  Please call with questions.  Emmaline Life, NP-C  11/30/2022, 10:04 AM [address] Office [phone] Fax [phone]

## 2023-05-31 NOTE — Progress Notes (Unsigned)
Cardiology Office Note:    Date:  06/01/2023   ID:  Kristin Reilly, DOB 12/02/1965, MRN [id number]  PCP:  Iona Hansen, NP   [facility] Providers Cardiologist:  Christell Constant, MD     Referring MD: Iona Hansen, NP   CC:  CAD f/u  History of Present Illness:    Kristin Reilly is a 57 y.o. adult with a hx of Morbid Obesity HTN with DM, OSA not on CPAP, CAD with history of inferior MI, HLD, former smoker; who presents for evaluation 06/05/21.   2022: Had stable CPET, Echo WNL, referred to healthy weight and wellness. 2023: Financial constraints around Health Weight and Wellness is a fit due to financial constraints and distance. 2024: had dental procedure.  Patient notes that she is doing good.   Since last visit notes that she doing well on Trulicity.  Was sick on Ozempic.  Slow weight loss. There are no interval hospital/ED visit.   EKG showed no RCA changes.  Stable from 2023.  Rare chest twinge.  No SOB/DOE and no PND/Orthopnea.  No weight gain or leg swelling.  No palpitations or syncope.   Past Medical History:  Diagnosis Date   Arthritis    Depression    Glaucoma    Hyperlipidemia    Hypertension    Sleep apnea    no cpap    Past Surgical History:  Procedure Laterality Date   APPENDECTOMY     COLONOSCOPY N/A 08/05/2015   Procedure: COLONOSCOPY;  Surgeon: Beverley Fiedler, MD;  Location: [facility];  Service: Gastroenterology;  Laterality: N/A;   COLONOSCOPY WITH PROPOFOL N/A 01/09/2019   Procedure: COLONOSCOPY WITH PROPOFOL;  Surgeon: Beverley Fiedler, MD;  Location: [facility];  Service: Gastroenterology;  Laterality: N/A;   CORONARY STENT INTERVENTION N/A 12/14/2019   Procedure: CORONARY STENT INTERVENTION;  Surgeon: Kathleene Hazel, MD;  Location: [facility];  Service: Cardiovascular;  Laterality: N/A;   CORONARY/GRAFT ACUTE MI REVASCULARIZATION N/A 12/14/2019   Procedure: Coronary/Graft Acute MI Revascularization;  Surgeon:  Kathleene Hazel, MD;  Location: [facility];  Service: Cardiovascular;  Laterality: N/A;   LEFT HEART CATH AND CORONARY ANGIOGRAPHY N/A 12/14/2019   Procedure: LEFT HEART CATH AND CORONARY ANGIOGRAPHY;  Surgeon: Kathleene Hazel, MD;  Location: [facility];  Service: Cardiovascular;  Laterality: N/A;   POLYPECTOMY  01/09/2019   Procedure: POLYPECTOMY;  Surgeon: Beverley Fiedler, MD;  Location: [facility];  Service: Gastroenterology;;   TONSILLECTOMY     WISDOM TOOTH EXTRACTION      Current Medications: Current Meds  Medication Sig   ACCU-CHEK SOFTCLIX LANCETS lancets 1 each by Misc.(Non-Drug; Combo Route) route daily.   amitriptyline (ELAVIL) 25 MG tablet Take 25 mg by mouth at bedtime as needed for sleep.    aspirin 81 MG chewable tablet Chew 1 tablet (81 mg total) by mouth daily.   atorvastatin (LIPITOR) 80 MG tablet Take 1 tablet (80 mg total) by mouth daily at 6 PM.   Blood Glucose Monitoring Suppl (GLUCOCOM BLOOD GLUCOSE MONITOR) DEVI 1 each by Misc.(Non-Drug; Combo Route) route daily. Include strips. Lancets, lancet device, control solution. batteries   cholecalciferol (VITAMIN D3) 25 MCG (1000 UT) tablet Take 1,000 Units by mouth daily.   ezetimibe (ZETIA) 10 MG tablet Take 1 tablet (10 mg total) by mouth daily.   FARXIGA 10 MG TABS tablet Take 10 mg by mouth daily.   glucose blood test strip 1 each  by Misc.(Non-Drug; Combo Route) route daily.   latanoprost (XALATAN) 0.005 % ophthalmic solution 1 drop at bedtime.   metFORMIN (GLUCOPHAGE-XR) 500 MG 24 hr tablet Take 500 mg by mouth daily with breakfast. Per patient taking 2 tablets in the morning and  1 tablet in the evening   metoprolol tartrate (LOPRESSOR) 25 MG tablet Take 1 tablet (25 mg total) by mouth 2 (two) times daily.   nitroGLYCERIN (NITROSTAT) 0.4 MG SL tablet Place 1 tablet (0.4 mg total) under the tongue every 5 (five) minutes x 3 doses as needed for chest pain.   NON FORMULARY Place 1 drop into  both eyes in the morning and at bedtime.   nystatin (MYCOSTATIN/NYSTOP) powder Apply 1 Bottle topically daily as needed (irritation).    omeprazole (PRILOSEC) 40 MG capsule Take 1 capsule by mouth daily.   TRULICITY 0.75 MG/0.5ML SOPN once a week.   valsartan (DIOVAN) 80 MG tablet Take 80 mg by mouth daily.      Allergies:   Fenofibrate and Tylox [oxycodone-acetaminophen]   Social History   Socioeconomic History   Marital status: Divorced    Spouse name: Not on file   Number of children: Not on file   Years of education: Not on file   Highest education level: Not on file  Occupational History   Not on file  Tobacco Use   Smoking status: Former    Current packs/day: 0.00    Types: Cigarettes    Quit date: 07/21/2005    Years since quitting: 17.8   Smokeless tobacco: Never   Tobacco comments:    quit 2008  Substance and Sexual Activity   Alcohol use: Yes    Alcohol/week: 0.0 standard drinks of alcohol    Comment: occasionally   Drug use: No   Sexual activity: Yes    Birth control/protection: I.U.D.  Other Topics Concern   Not on file  Social History Narrative   Not on file   Social Determinants of Health   Financial Resource Strain: High Risk (04/01/2022)   [facility] prior to 01/01/2023[facility] visits prior to 01/01/2023.   Overall Financial Resource Strain (CARDIA)    Difficulty of Paying Living Expenses: Hard  Food Insecurity: Medium Risk (04/13/2023)   [facility]   Food vital sign    Within the past 12 months, you worried that your food would run out before you got money to buy more: Sometimes true    Within the past 12 months, the food you bought just didn't last and you didn't have money to get more. : Sometimes true  Transportation Needs: Not on file (04/13/2023)  Recent Concern: Transportation Needs - Unmet Transportation Needs (04/13/2023)   Received  from [employer]    In the past 12 months, has lack of reliable transportation kept you from medical appointments, meetings, work or from getting things needed for daily living? : Yes  Physical Activity: Insufficiently Active (04/01/2022)   [facility] visits prior to 01/01/2023., [facility] visits prior to 01/01/2023.   Exercise Vital Sign    Days of Exercise per Week: 2 days    Minutes of Exercise per Session: 10 min  Stress: No Stress Concern Present (04/01/2022)   [facility], [facility] prior to 01/01/2023., [facility] visits prior to 01/01/2023.   [employer] of Occupational Health - Occupational Stress Questionnaire    Feeling of Stress : Only a little  Social Connections: Unknown (04/01/2022)   [facility], [facility] prior to 01/01/2023., [facility] visits prior to 01/01/2023.   Social Advertising account executive [NHANES]    Frequency of Communication with Friends and Family: Twice a week    Frequency of Social Gatherings with Friends and Family: Patient declined    Attends Religious Services: Patient declined    Database administrator or Organizations: No    Attends Engineer, structural: Patient declined    Marital Status: Divorced     Family History: The patient's family history includes ADD / ADHD in her son; Alcoholism in her brother; Aneurysm in her maternal grandmother; Bipolar disorder in her sister; CVA in her mother; Cerebral aneurysm in her mother; Clotting disorder in her mother; Mental illness in her sister; Throat cancer in her maternal grandfather. There is no history of Colon cancer, Breast cancer, Esophageal cancer, Rectal cancer, or Stomach cancer.  ROS:   Please see the history of present  illness.      EKGs/Labs/Other Studies Reviewed:    The following studies were reviewed today:  Cardiac Studies & Procedures   CARDIAC CATHETERIZATION  CARDIAC CATHETERIZATION 12/14/2019  Narrative  Prox RCA-1 lesion is 30% stenosed.  Prox RCA-2 lesion is 100% stenosed.  Mid Cx to Dist Cx lesion is 30% stenosed.  Dist LAD lesion is 30% stenosed.  A drug-eluting stent was successfully placed using a SYNERGY XD 3.0X16.  Post intervention, there is a 0% residual stenosis.  1. Inferior STEMI secondary to thrombotic occlusion of the mid RCA 2. Successful PTCA/DES x 1 mid RCA 3. Mild non-obstructive disease in the proximal RCA, distal Circumflex and distal LAD 4. Elevated LVEDP  Recommendations: Will continue Aggrastat drip for 12 hours given heavy thrombus burden within the stented segment. Continue ASA and Brilinta for one year. Echo later today. High intensity statin. Beta blocker as heart rate tolerates.  Findings Coronary Findings Diagnostic  Dominance: Right  Left Anterior Descending Vessel is large. Dist LAD lesion is 30% stenosed.  Left Circumflex Vessel is large. Mid Cx to Dist Cx lesion is 30% stenosed.  Right Coronary Artery Vessel is large. Prox RCA-1 lesion is 30% stenosed. Prox RCA-2 lesion is 100% stenosed. The lesion is thrombotic.  Intervention  Prox RCA-2 lesion Stent CATHETER LAUNCHER 6FR JR4 guide catheter was inserted. Lesion crossed with guidewire using a WIRE COUGAR XT STRL 190CM. Pre-stent angioplasty was performed using a BALLOON SAPPHIRE 2.5X12. A drug-eluting stent was successfully placed using a SYNERGY XD 3.0X16. Stent strut is well apposed. Post-stent angioplasty was performed using a BALLOON SAPPHIRE [place] 3.5X12. Post-Intervention Lesion Assessment The intervention was successful. Pre-interventional TIMI flow is 0. Post-intervention TIMI flow is 3. No complications occurred at this lesion. There is a 0% residual stenosis post  intervention.     ECHOCARDIOGRAM  ECHOCARDIOGRAM COMPLETE 06/22/2021  Narrative ECHOCARDIOGRAM REPORT    Patient Name:   VERLISA TACKETT Date of Exam: 06/22/2021 Medical Rec #:  [id number]          Height:       66.0 in Accession #:    [id number]         Weight:       307.6 lb Date of Birth:  1966-02-10  BSA:          2.401 m Patient Age:    55 years           BP:           120/80 mmHg Patient Gender: F                  HR:           89 bpm. Exam Location:  [address]  Procedure: 2D Echo, 3D Echo, Cardiac Doppler and Color Doppler  Indications:    I25.10 CAD  History:        Patient has prior history of Echocardiogram examinations, most recent 12/14/2019. CAD and Previous Myocardial Infarction, Signs/Symptoms:Dyspnea; Risk Factors:Hypertension, Dyslipidemia, Former Smoker, Diabetes and Sleep Apnea. Morbid Obesity.  Sonographer:    Farrel Conners RDCS Referring Phys: [id number] [facility] [place]  IMPRESSIONS   1. Left ventricular ejection fraction, by estimation, is >75%. The left ventricle has hyperdynamic function. The left ventricle has no regional wall motion abnormalities. Left ventricular diastolic function could not be evaluated. 2. Right ventricular systolic function is normal. The right ventricular size is normal. There is normal pulmonary artery systolic pressure. 3. The mitral valve is normal in structure. No evidence of mitral valve regurgitation. 4. The aortic valve is normal in structure. Aortic valve regurgitation is trivial. 5. Aortic dilatation noted. There is mild dilatation of the ascending aorta, measuring 38 mm. 6. The inferior vena cava is normal in size with greater than 50% respiratory variability, suggesting right atrial pressure of 3 mmHg.  FINDINGS Left Ventricle: Left ventricular ejection fraction, by estimation, is >75%. The left ventricle has hyperdynamic function. The left ventricle has no regional wall motion abnormalities. 3D  left ventricular ejection fraction analysis performed but not reported based on interpreter judgement due to suboptimal quality. The left ventricular internal cavity size was normal in size. There is no left ventricular hypertrophy. Left ventricular diastolic function could not be evaluated.  Right Ventricle: The right ventricular size is normal. Right vetricular wall thickness was not assessed. Right ventricular systolic function is normal. There is normal pulmonary artery systolic pressure. The tricuspid regurgitant velocity is 1.49 m/s, and with an assumed right atrial pressure of 3 mmHg, the estimated right ventricular systolic pressure is 11.9 mmHg.  Left [facility]: Left atrial size was normal in size.  Right [facility]: Right atrial size was normal in size.  Pericardium: There is no evidence of pericardial effusion.  Mitral Valve: The mitral valve is normal in structure. No evidence of mitral valve regurgitation.  Tricuspid Valve: The tricuspid valve is normal in structure. Tricuspid valve regurgitation is trivial.  Aortic Valve: The aortic valve is normal in structure. Aortic valve regurgitation is trivial. Aortic regurgitation PHT measures 467 msec.  Pulmonic Valve: The pulmonic valve was not well visualized. Pulmonic valve regurgitation is not visualized.  Aorta: Aortic dilatation noted. There is mild dilatation of the ascending aorta, measuring 38 mm.  Venous: The inferior vena cava is normal in size with greater than 50% respiratory variability, suggesting right atrial pressure of 3 mmHg.  IAS/Shunts: No atrial level shunt detected by color flow Doppler.   LEFT VENTRICLE PLAX 2D LVIDd:         4.70 cm LVIDs:         2.20 cm LV PW:         1.10 cm LV IVS:        1.00 cm LVOT diam:     2.40 cm  3D Volume EF: LV SV:         139      3D EF:        56 % LV SV Index:   58       LV EDV:       115 ml LVOT Area:     4.52 cm LV ESV:       51 ml LV SV:        64 ml  RIGHT  VENTRICLE RV S prime:     13.40 cm/s TAPSE (M-mode): 2.1 cm  LEFT ATRIUM             Index       RIGHT ATRIUM           Index LA diam:        4.60 cm 1.92 cm/m  RA Area:     18.60 cm LA Vol (A2C):   73.4 ml 30.57 ml/m RA Volume:   48.80 ml  20.32 ml/m LA Vol (A4C):   48.4 ml 20.16 ml/m LA Biplane Vol: 61.2 ml 25.49 ml/m AORTIC VALVE LVOT Vmax:   129.00 cm/s LVOT Vmean:  88.500 cm/s LVOT VTI:    0.308 m AI PHT:      467 msec  AORTA Ao Root diam: 3.20 cm Ao Asc diam:  3.79 cm  MITRAL VALVE               TRICUSPID VALVE MV Area (PHT): cm         TR Peak grad:   8.9 mmHg MV Decel Time: 222 msec    TR Vmax:        149.00 cm/s MV E velocity: 69.50 cm/s MV A velocity: 94.60 cm/s  SHUNTS MV E/A ratio:  0.73        Systemic VTI:  0.31 m Systemic Diam: 2.40 cm  Dietrich Pates MD Electronically signed by Dietrich Pates MD Signature Date/Time: 06/22/2021/12:06:01 PM    Final               Recent Labs: No results found for requested labs within last 365 days.  Recent Lipid Panel    Component Value Date/Time   CHOL 87 (L) 05/27/2020 0905   TRIG 93 05/27/2020 0905   HDL 33 (L) 05/27/2020 0905   CHOLHDL 2.6 05/27/2020 0905   CHOLHDL 4.4 12/13/2019 2149   VLDL 37 12/13/2019 2149   LDLCALC 36 05/27/2020 0905        Physical Exam:    VS:  BP 120/88   Pulse 88   Ht 5\' 6"  (1.676 m)   Wt 287 lb (130.2 kg)   SpO2 95%   BMI 46.32 kg/m     Wt Readings from Last 3 Encounters:  06/01/23 287 lb (130.2 kg)  04/05/22 (!) 312 lb 6.4 oz (141.7 kg)  10/12/21 (!) 313 lb 12.8 oz (142.3 kg)    Gen: No distress, morbid obesity   Neck: No JVD Cardiac: No Rubs or Gallops, no murmur, normal rhythm +2 radial pulses Respiratory: Clear to auscultation bilaterally, normal effort, normal  respiratory rate GI: Soft, nontender, non-distended  MS: No edema;  moves all extremities Integument: Skin feels warm Neuro:  At time of evaluation, alert and oriented to person/place/time/situation   Psych: Normal affect, patient feels a bit down   ASSESSMENT:    1. Coronary artery disease of native artery of native heart with stable angina pectoris (HCC)   2. Aortic atherosclerosis (HCC)   3. Obesity,  diabetes, and hypertension syndrome (HCC)   4. OSA (obstructive sleep apnea)   5. Hyperlipidemia associated with type 2 diabetes mellitus (HCC)     PLAN:    Coronary Artery Disease; Obstructive Former Tobacco Abuse HLD and Aortic Atherosclerosis - asymptomatic (her anginal equivalent was fatigue and jaw pain like her teeth was going to fall out) - anatomy: s/p pRCA PCI - continue ASA 81 mg  - On lipitor 80 mg PO daily On Zetia 10 mg goal LDL < 55  - continue BB tartrate 25 mg PO BID can stop for fatigue (keeping presently due to rare  chest twinge) - continue PRN nitrates (None needed, never had headache)  - is still smoke free  Morbid Obesity HTN with DM - continue ARB 80 mg Diovan - She has lost ~ 40 lbs on Trulicity  OSA  CPAP - CPAP is recommended, she is still in the contemplative phase for resuming treatment  One year me or APP   Medication Adjustments/Labs and Tests Ordered: Current medicines are reviewed at length with the patient today.  Concerns regarding medicines are outlined above.  Orders Placed This Encounter  Procedures   EKG 12-Lead     No orders of the defined types were placed in this encounter.    Patient Instructions  Medication Instructions:  Your physician recommends that you continue on your current medications as directed. Please refer to the Current Medication list given to you today.  *If you need a refill on your cardiac medications before your next appointment, please call your pharmacy*   Lab Work: NONE If you have labs (blood work) drawn today and your tests are completely normal, you will receive your results only by: MyChart Message (if you have MyChart) OR A paper copy in the mail If you have any lab test that is  abnormal or we need to change your treatment, we will call you to review the results.   Testing/Procedures: NONE   Follow-Up: At [facility], you and your health needs are our priority.  As part of our continuing mission to provide you with exceptional heart care, we have created designated Provider Care Teams.  These Care Teams include your primary Cardiologist (physician) and Advanced Practice Providers (APPs -  Physician Assistants and Nurse Practitioners) who all work together to provide you with the care you need, when you need it.   Your next appointment:   1 year(s)  Provider:   Riley Lam, MD       Signed, Christell Constant, MD  06/01/2023 9:56 AM     [facility]

## 2023-06-01 ENCOUNTER — Encounter: Payer: Self-pay | Admitting: Internal Medicine

## 2023-06-01 ENCOUNTER — Ambulatory Visit: Payer: Medicaid Other | Admitting: Internal Medicine

## 2023-06-01 VITALS — BP 120/88 | HR 88 | Ht 66.0 in | Wt 287.0 lb

## 2023-06-01 DIAGNOSIS — G4733 Obstructive sleep apnea (adult) (pediatric): Secondary | ICD-10-CM | POA: Diagnosis not present

## 2023-06-01 DIAGNOSIS — E1159 Type 2 diabetes mellitus with other circulatory complications: Secondary | ICD-10-CM

## 2023-06-01 DIAGNOSIS — E1169 Type 2 diabetes mellitus with other specified complication: Secondary | ICD-10-CM

## 2023-06-01 DIAGNOSIS — E785 Hyperlipidemia, unspecified: Secondary | ICD-10-CM

## 2023-06-01 DIAGNOSIS — I25118 Atherosclerotic heart disease of native coronary artery with other forms of angina pectoris: Secondary | ICD-10-CM

## 2023-06-01 DIAGNOSIS — E669 Obesity, unspecified: Secondary | ICD-10-CM

## 2023-06-01 DIAGNOSIS — I700 Atherosclerosis of aorta: Secondary | ICD-10-CM

## 2023-06-01 DIAGNOSIS — Z7984 Long term (current) use of oral hypoglycemic drugs: Secondary | ICD-10-CM

## 2023-06-01 DIAGNOSIS — Z87891 Personal history of nicotine dependence: Secondary | ICD-10-CM

## 2023-06-01 DIAGNOSIS — I152 Hypertension secondary to endocrine disorders: Secondary | ICD-10-CM

## 2023-06-01 NOTE — Patient Instructions (Signed)

## 2024-04-25 ENCOUNTER — Telehealth: Payer: Self-pay | Admitting: Internal Medicine

## 2024-04-25 NOTE — Telephone Encounter (Signed)
 Patient calling to scheduled hospital procedure. Please advise.   Thank you

## 2024-04-25 NOTE — Telephone Encounter (Signed)
 Patient is calling to schedule colonoscopy at the hospital for recall adenomatous polyp 2020. In reviewing patient's chart, it appears she was primarily having procedures at the hospital due to BMI>50. As per patient as well as most recent visit from [facility], patient's BMI is now around 42.  Last Filed Vital Signs - documented in this encounter  Last Filed Vital Signs Vital Sign Reading Time Taken Comments  Blood Pressure 136/80 03/27/2024 2:30 PM EDT    Pulse 72 03/27/2024 2:30 PM EDT    Temperature 35.6 C (96.1 F) 03/27/2024 2:30 PM EDT    Respiratory Rate - -    Oxygen Saturation 97% 03/27/2024 2:30 PM EDT    Inhaled Oxygen Concentration - -    Weight 122 kg (268 lb 12.8 oz) 03/27/2024 2:30 PM EDT    Height 167.6 cm (5' 6) 03/27/2024 2:30 PM EDT    Body Mass Index 43.39 03/27/2024 2:30 PM EDT    Patient denies any cardiac history, anticoagulants (other than ASA 81 mg daily) or respiratory issues. Denies having ever been told she has any issues with anesthesia or intubation.  Are you okay with us  moving her procedure back to [facility] with the above information or would you rather she be seen first?  *note for myself, patient states she can only do Wednesday appointments

## 2024-04-28 NOTE — Telephone Encounter (Signed)
Ok for [facility] 

## 2024-04-30 NOTE — Telephone Encounter (Signed)
 Spoke to patient to advise she may have procedure in [facility] following weight reduction. Patient has scheduled [facility] colonoscopy for 07/04/24 and telephone previsit for 06/20/24.

## 2024-05-03 ENCOUNTER — Other Ambulatory Visit: Payer: Self-pay

## 2024-05-03 ENCOUNTER — Encounter (HOSPITAL_COMMUNITY): Payer: Self-pay | Admitting: Emergency Medicine

## 2024-05-03 ENCOUNTER — Emergency Department (HOSPITAL_COMMUNITY)

## 2024-05-03 ENCOUNTER — Emergency Department (HOSPITAL_COMMUNITY)
Admission: EM | Admit: 2024-05-03 | Discharge: 2024-05-04 | Discharge status: Against Medical Advice | Attending: Emergency Medicine | Admitting: Emergency Medicine

## 2024-05-03 DIAGNOSIS — Z5321 Procedure and treatment not carried out due to patient leaving prior to being seen by health care provider: Secondary | ICD-10-CM | POA: Diagnosis not present

## 2024-05-03 DIAGNOSIS — R519 Headache, unspecified: Secondary | ICD-10-CM | POA: Diagnosis present

## 2024-05-03 LAB — BASIC METABOLIC PANEL WITH GFR
Anion gap: 10 (ref 5–15)
BUN: 19 mg/dL (ref 6–20)
CO2: 24 mmol/L (ref 22–32)
Calcium: 9.1 mg/dL (ref 8.9–10.3)
Chloride: 107 mmol/L (ref 98–111)
Creatinine, Ser: 0.63 mg/dL (ref 0.44–1.00)
GFR, Estimated: >60 mL/min (ref >60)
Glucose, Bld: 123 mg/dL — ABNORMAL HIGH (ref 70–99)
Potassium: 3.6 mmol/L (ref 3.5–5.1)
Sodium: 141 mmol/L (ref 135–145)

## 2024-05-03 LAB — CBC WITH DIFFERENTIAL/PLATELET
Abs Immature Granulocytes: 0.03 10*3/uL (ref 0.00–0.07)
Basophils Absolute: 0 10*3/uL (ref 0.0–0.1)
Basophils Relative: 0 %
Eosinophils Absolute: 0.1 10*3/uL (ref 0.0–0.5)
Eosinophils Relative: 1 %
HCT: 49.5 % — ABNORMAL HIGH (ref 36.0–46.0)
Hemoglobin: 16.1 g/dL — ABNORMAL HIGH (ref 12.0–15.0)
Immature Granulocytes: 0 %
Lymphocytes Relative: 28 %
Lymphs Abs: 2.5 10*3/uL (ref 0.7–4.0)
MCH: 26.9 pg (ref 26.0–34.0)
MCHC: 32.5 g/dL (ref 30.0–36.0)
MCV: 82.8 fL (ref 80.0–100.0)
Monocytes Absolute: 0.6 10*3/uL (ref 0.1–1.0)
Monocytes Relative: 7 %
Neutro Abs: 5.9 10*3/uL (ref 1.7–7.7)
Neutrophils Relative %: 64 %
Platelets: 287 10*3/uL (ref 150–400)
RBC: 5.98 MIL/uL — ABNORMAL HIGH (ref 3.87–5.11)
RDW: 14.3 % (ref 11.5–15.5)
WBC: 9.3 10*3/uL (ref 4.0–10.5)
nRBC: 0 % (ref 0.0–0.2)

## 2024-05-03 MED ORDER — ACETAMINOPHEN 500 MG PO TABS
1000.0000 mg | ORAL_TABLET | Freq: Once | ORAL | Status: AC
  Administered 2024-05-03: 1000 mg via ORAL
  Filled 2024-05-03: qty 2

## 2024-05-03 NOTE — ED Provider Triage Note (Signed)
 Emergency Medicine Provider Triage Evaluation Note  PACEY WILLADSEN , a 58 y.o. female  was evaluated in triage.  Pt complains of headache for few days after starting new BP med. Today thought her pupils were unequal and called EMS. Now resolved.  Review of Systems  Positive: headache Negative: Visual change, fevers, head trauma, weakness, numbness, tingling  Physical Exam  BP (!) 144/77 (BP Location: Left Arm)   Pulse 80   Temp 98.2 F (36.8 C) (Oral)   Resp 16   Ht 5' 7 (1.702 m)   Wt 122.5 kg   SpO2 96%   BMI 42.29 kg/m  Gen:   Awake, no distress   Resp:  Normal effort  MSK:   Moves extremities without difficulty  Other:  CN2-12 intact, PERRL, 5/5 BLE and BUE  Medical Decision Making  Medically screening exam initiated at 4:59 PM.  Appropriate orders placed.  ALAYSHA JEFCOAT was informed that the remainder of the evaluation will be completed by another provider, this initial triage assessment does not replace that evaluation, and the importance of remaining in the ED until their evaluation is complete.     Francesca Elsie CROME, MD 05/03/24 1700

## 2024-05-03 NOTE — ED Triage Notes (Signed)
 Pt via [facility] c/o headache x 3 days. Rated 3/10, worse in the morning. Recent MI, no prior stroke. Hx DM2 on metformin; no thinners. A/O x 4.   BP 140/80 CBG 129 HR 90 O2 98% RA

## 2024-05-04 NOTE — ED Notes (Signed)
 Pt stated she would be leaving

## 2024-06-20 ENCOUNTER — Ambulatory Visit (AMBULATORY_SURGERY_CENTER): Admitting: *Deleted

## 2024-06-20 VITALS — Ht 66.0 in | Wt 270.0 lb

## 2024-06-20 DIAGNOSIS — Z860100 Personal history of colon polyps, unspecified: Secondary | ICD-10-CM

## 2024-06-20 DIAGNOSIS — Z83719 Family history of colon polyps, unspecified: Secondary | ICD-10-CM

## 2024-06-20 MED ORDER — NA SULFATE-K SULFATE-MG SULF 17.5-3.13-1.6 GM/177ML PO SOLN: 1.0000 | Freq: Once | ORAL | 0 refills | Status: AC

## 2024-06-20 NOTE — Progress Notes (Signed)
 Pt's name and DOB verified at the beginning of the pre-visit with 2 identifiers  Pt denies any difficulty with ambulating,sitting, laying down or rolling side to side   Pt uses ambulation assistance device or has issues with mobiiity  Pt has no issues moving head neck or swallowing  No egg or soy allergy known to patient   No issues known to pt with past sedation with any surgeries or procedures  No FH of Malignant Hyperthermia  Pt is not on home 02   Pt is not on blood thinners   Pt has frequent issues with constipation RN instructed pt to use Miralax per bottles instructions a week before prep days. Pt states they will  Pt is not on dialysis  Pt denise any abnormal heart rhythms   Pt denies any upcoming cardiac testing  Patient's chart reviewed by Norleen Schillings CNRA prior to pre-visit and patient appropriate for the [facility].  Pre-visit completed and red dot placed by patient's name on their procedure day (on provider's schedule).    Visit by phone  Pt states weight is 270 lb  Instructions reviewed. Pt given  both [facility] main # and MD on call # prior to instructions.   Informed pt to come in at the time discussed and is shown on PV instructions. Pt informed that they are coming in i.e. cloths change.,IV placement , Consent signing and meeting CRNA. Pt states understanding after  given opportunity to ask questions t after all  instructions given.  Pt instructed to use Singlecare.com or GoodRx for a price reduction on prep  Instructed pt to review instructions again prior to procedure and call main # given if has any questions.. Pt states they will.  Instructed pt where to find PV instructions in My Chart  Instructed pt on all aspects of written instructions including med holds clothing to wear and foods to eat and not eat as well as after procedure legal restrictions and to cal MD on call if needed.. Pt states understanding.

## 2024-06-21 ENCOUNTER — Telehealth: Payer: Self-pay | Admitting: Internal Medicine

## 2024-06-21 NOTE — Telephone Encounter (Signed)
 Patient requesting f/u call to discuss medications. Please advise.

## 2024-06-21 NOTE — Telephone Encounter (Signed)
 RN returned call to patient who stated she wanted to know if she could still take magnesium glycinate and zinc or if she needs to hold it. Per MD instructions and protocols for pre-procedure medications, RN instructed patient that it is fine for patient to take those supplements without any need to hold them.  Patient stated understanding and had no further questions.

## 2024-07-04 ENCOUNTER — Encounter: Payer: Self-pay | Admitting: Internal Medicine

## 2024-07-04 ENCOUNTER — Ambulatory Visit: Admitting: Internal Medicine

## 2024-07-04 VITALS — BP 127/61 | HR 57 | Temp 97.4°F | Resp 10 | Ht 66.0 in | Wt 270.0 lb

## 2024-07-04 DIAGNOSIS — K5730 Diverticulosis of large intestine without perforation or abscess without bleeding: Secondary | ICD-10-CM

## 2024-07-04 DIAGNOSIS — D128 Benign neoplasm of rectum: Secondary | ICD-10-CM

## 2024-07-04 DIAGNOSIS — Z860100 Personal history of colon polyps, unspecified: Secondary | ICD-10-CM

## 2024-07-04 DIAGNOSIS — Z1211 Encounter for screening for malignant neoplasm of colon: Secondary | ICD-10-CM

## 2024-07-04 DIAGNOSIS — Z860101 Personal history of adenomatous and serrated colon polyps: Secondary | ICD-10-CM

## 2024-07-04 DIAGNOSIS — D124 Benign neoplasm of descending colon: Secondary | ICD-10-CM

## 2024-07-04 DIAGNOSIS — D123 Benign neoplasm of transverse colon: Secondary | ICD-10-CM | POA: Diagnosis not present

## 2024-07-04 MED ORDER — SODIUM CHLORIDE 0.9 % IV SOLN: 500.0000 mL | Freq: Once | INTRAVENOUS | Status: DC

## 2024-07-04 NOTE — Progress Notes (Signed)
 Pt's states no medical or surgical changes since previsit or office visit.

## 2024-07-04 NOTE — Progress Notes (Signed)
 Approx 1000, pt started wretching and clear discharge of fluid from mouth.  Sedation pause, airway opened, O2 inc and suctioning started. Zofran  and robinul  had already been given. Pt allowed to wake up enough to cough and swallow on command.  BS clear initiallly

## 2024-07-04 NOTE — Patient Instructions (Addendum)
-  5 polyps removed and sent to pathology - Resume previous diet. - Continue present medications. - Await pathology results. - Repeat colonoscopy is recommended for    surveillance. The colonoscopy date will be    determined after pathology results from today's     exam become available for review.  YOU HAD AN ENDOSCOPIC PROCEDURE TODAY AT THE [facility]:   Refer to the procedure report that was given to you for any specific questions about what was found during the examination.  If the procedure report does not answer your questions, please call your gastroenterologist to clarify.  If you requested that your care partner not be given the details of your procedure findings, then the procedure report has been included in a sealed envelope for you to review at your convenience later.  YOU SHOULD EXPECT: Some feelings of bloating in the abdomen. Passage of more gas than usual.  Walking can help get rid of the air that was put into your GI tract during the procedure and reduce the bloating. If you had a lower endoscopy (such as a colonoscopy or flexible sigmoidoscopy) you may notice spotting of blood in your stool or on the toilet paper. If you underwent a bowel prep for your procedure, you may not have a normal bowel movement for a few days.  Please Note:  You might notice some irritation and congestion in your nose or some drainage.  This is from the oxygen used during your procedure.  There is no need for concern and it should clear up in a day or so.  SYMPTOMS TO REPORT IMMEDIATELY:  Following lower endoscopy (colonoscopy or flexible sigmoidoscopy):  Excessive amounts of blood in the stool  Significant tenderness or worsening of abdominal pains  Swelling of the abdomen that is new, acute  Fever of 100F or higher  For urgent or emergent issues, a gastroenterologist can be reached at any hour by calling [phone]. Do not use MyChart messaging for urgent concerns.    DIET:   We do recommend a small meal at first, but then you may proceed to your regular diet.  Drink plenty of fluids but you should avoid alcoholic beverages for 24 hours.  ACTIVITY:  You should plan to take it easy for the rest of today and you should NOT DRIVE or use heavy machinery until tomorrow (because of the sedation medicines used during the test).    FOLLOW UP: Our staff will call the number listed on your records the next business day following your procedure.  We will call around 7:15- 8:00 am to check on you and address any questions or concerns that you may have regarding the information given to you following your procedure. If we do not reach you, we will leave a message.     If any biopsies were taken you will be contacted by phone or by letter within the next 1-3 weeks.  Please call us  at [phone] if you have not heard about the biopsies in 3 weeks.    SIGNATURES/CONFIDENTIALITY: You and/or your care partner have signed paperwork which will be entered into your electronic medical record.  These signatures attest to the fact that that the information above on your After Visit Summary has been reviewed and is understood.  Full responsibility of the confidentiality of this discharge information lies with you and/or your care-partner.

## 2024-07-04 NOTE — Op Note (Signed)
 [facility] Patient Name: Kristin Reilly Procedure Date: 07/04/2024 9:37 AM MRN: [id number] Endoscopist: Gordy CHRISTELLA Starch , MD, [id number] Age: 58 Referring MD:  Date of Birth: 02-05-1966 Gender: Female Account #: [id number] Procedure:                Colonoscopy Indications:              High risk colon cancer surveillance: Personal                            history of adenomatous and sessile serrated colon                            polyps, Last colonoscopy: March 2020 (SSP x 1),                            2016 (TA = 1 cm) Medicines:                Monitored Anesthesia Care Procedure:                Pre-Anesthesia Assessment:                           - Prior to the procedure, a History and Physical                            was performed, and patient medications and                            allergies were reviewed. The patient's tolerance of                            previous anesthesia was also reviewed. The risks                            and benefits of the procedure and the sedation                            options and risks were discussed with the patient.                            All questions were answered, and informed consent                            was obtained. Prior Anticoagulants: The patient has                            taken no anticoagulant or antiplatelet agents. ASA                            Grade Assessment: III - A patient with severe                            systemic disease. After reviewing the risks and  benefits, the patient was deemed in satisfactory                            condition to undergo the procedure.                           After obtaining informed consent, the colonoscope                            was passed under direct vision. Throughout the                            procedure, the patient's blood pressure, pulse, and                            oxygen saturations were monitored  continuously. The                            Olympus Scope SN [id number] was introduced through the                            anus and advanced to the cecum, identified by                            appendiceal orifice and ileocecal valve. The                            colonoscopy was performed without difficulty. The                            patient tolerated the procedure well. The quality                            of the bowel preparation was good. The ileocecal                            valve, appendiceal orifice, and rectum were                            photographed. Scope In: 9:50:29 AM Scope Out: 10:12:19 AM Scope Withdrawal Time: 0 hours 19 minutes 0 seconds  Total Procedure Duration: 0 hours 21 minutes 50 seconds  Findings:                 The digital rectal exam was normal.                           Three sessile polyps were found in the transverse                            colon. The polyps were 4 to 6 mm in size. These                            polyps were removed with a cold snare. Resection  and retrieval were complete.                           A 4 mm polyp was found in the descending colon. The                            polyp was sessile. The polyp was removed with a                            cold snare. Resection and retrieval were complete.                           A 6 mm polyp was found in the rectum. The polyp was                            sessile. The polyp was removed with a cold snare.                            Resection and retrieval were complete.                           Multiple medium-mouthed and small-mouthed                            diverticula were found in the sigmoid colon,                            descending colon, transverse colon, ascending colon                            and cecum.                           The retroflexed view of the distal rectum and anal                            verge was normal and  showed no anal or rectal                            abnormalities. Complications:            No immediate complications. Estimated Blood Loss:     Estimated blood loss was minimal. Impression:               - Three 4 to 6 mm polyps in the transverse colon,                            removed with a cold snare. Resected and retrieved.                           - One 4 mm polyp in the descending colon, removed                            with a cold snare. Resected and  retrieved.                           - One 6 mm polyp in the rectum, removed with a cold                            snare. Resected and retrieved.                           - Moderate diverticulosis in the sigmoid colon, in                            the descending colon, in the transverse colon, in                            the ascending colon and in the cecum.                           - The distal rectum and anal verge are normal on                            retroflexion view. Recommendation:           - Patient has a contact number available for                            emergencies. The signs and symptoms of potential                            delayed complications were discussed with the                            patient. Return to normal activities tomorrow.                            Written discharge instructions were provided to the                            patient.                           - Resume previous diet.                           - Continue present medications.                           - Await pathology results.                           - Repeat colonoscopy is recommended for                            surveillance. The colonoscopy date will be  determined after pathology results from today's                            exam become available for review. Gordy CHRISTELLA Starch, MD 07/04/2024 10:15:57 AM This report has been signed electronically.

## 2024-07-04 NOTE — Progress Notes (Signed)
 Report to PACU, RN, vss, BBS= Clear.

## 2024-07-04 NOTE — Progress Notes (Signed)
 Called to room to assist during endoscopic procedure.  Patient ID and intended procedure confirmed with present staff. Received instructions for my participation in the procedure from the performing physician.

## 2024-07-04 NOTE — Progress Notes (Signed)
 Pt did same thing again after re try of sedation.  Sedation completely halted to the end

## 2024-07-05 ENCOUNTER — Telehealth: Payer: Self-pay | Admitting: *Deleted

## 2024-07-05 NOTE — Telephone Encounter (Signed)
  Follow up Call-     07/04/2024    9:20 AM  Call back number  Post procedure Call Back phone  # [phone]  Permission to leave phone message Yes     Patient questions:  Do you have a fever, pain , or abdominal swelling? No. Pain Score  0 *  Have you tolerated food without any problems? Yes.    Have you been able to return to your normal activities? Yes.    Do you have any questions about your discharge instructions: Diet   No. Medications  No. Follow up visit  No.  Do you have questions or concerns about your Care? No.  Actions: * If pain score is 4 or above: No action needed, pain <4.

## 2024-07-06 ENCOUNTER — Telehealth: Payer: Self-pay

## 2024-07-06 LAB — SURGICAL PATHOLOGY

## 2024-07-06 NOTE — Telephone Encounter (Signed)
 Error entered. Routed to E2C2 to schedule new patient visit. No approval required for TOC/New Patient.

## 2024-07-06 NOTE — Telephone Encounter (Signed)
 Copied from CRM [id number]. Topic: Appointments - Transfer of Care >> Jul 05, 2024 10:02 AM Tinnie C wrote: Pt is requesting to transfer FROM: [facility] Pay Pt is requesting to transfer TO: Dr. Sebastian Reason for requested transfer: Appts are changing too much  It is the responsibility of the team the patient would like to transfer to (Dr. Sebastian) to reach out to the patient if for any reason this transfer is not acceptable.

## 2024-07-06 NOTE — Telephone Encounter (Signed)
 Forwarding message below

## 2024-07-07 ENCOUNTER — Ambulatory Visit: Payer: Self-pay | Admitting: Internal Medicine

## 2025-01-30 ENCOUNTER — Ambulatory Visit: Admitting: Internal Medicine
